# Patient Record
Sex: Female | Born: 1995 | Hispanic: No | Marital: Single | Smoking: Current some day smoker
Health system: Southern US, Community
[De-identification: ages and names within clinical notes are randomized; demographics above are authoritative.]

## PROBLEM LIST (undated history)

## (undated) DIAGNOSIS — D649 Anemia, unspecified: Secondary | ICD-10-CM

---
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
## 2017-02-18 IMAGING — CR DG CHEST 1V PORT
1 series · 1 of 1 positions shown · non-contrast
Comparison: None.

CLINICAL DATA: Found unresponsive. Hyperventilated. Altered mental
status. Anemia.

EXAM:
PORTABLE CHEST 1 VIEW

[portable]
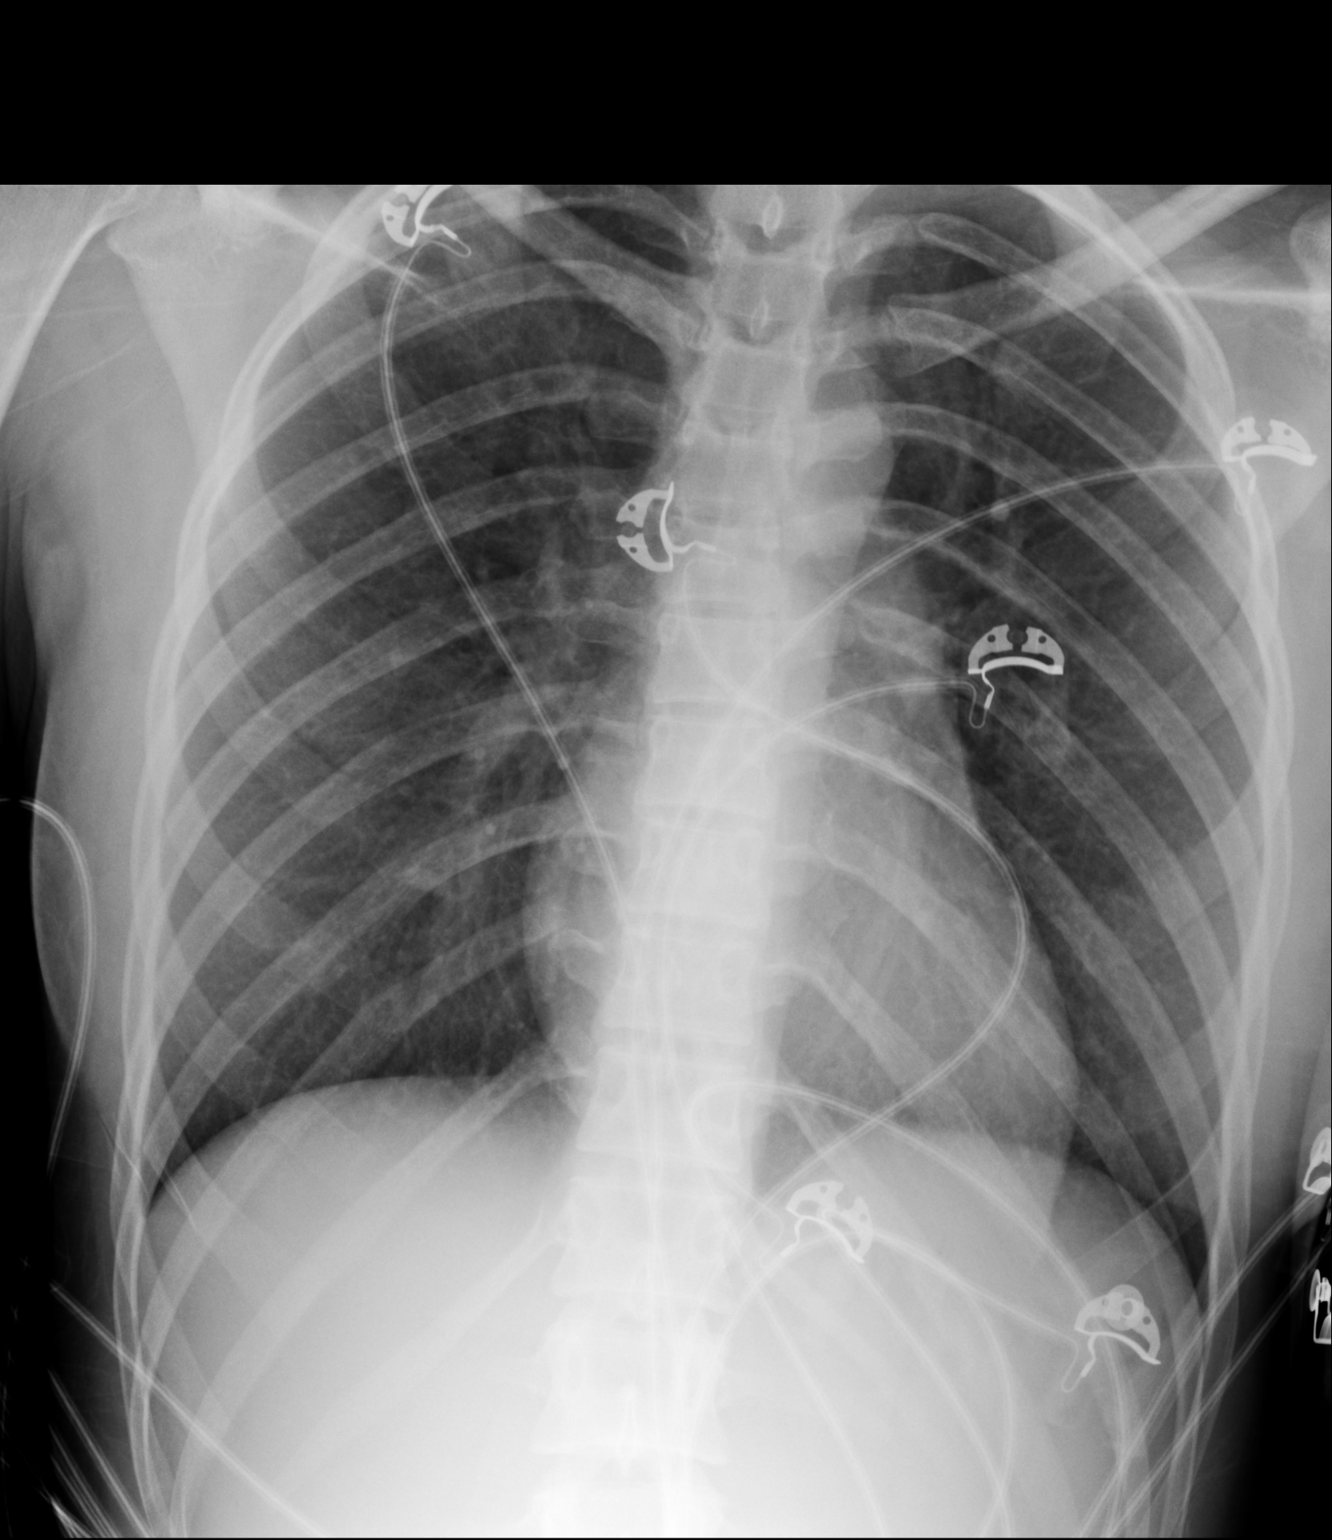

[1 of 1 positions shown; findings below may reference images not displayed]

FINDINGS: The heart size and mediastinal contours are within normal limits.
Both lungs are clear. No evidence of pneumothorax or pleural
effusion.
IMPRESSION: No active disease.

## 2019-04-10 ENCOUNTER — Ambulatory Visit: Payer: Self-pay | Admitting: Nurse Practitioner

## 2019-04-10 ENCOUNTER — Encounter: Payer: Self-pay | Admitting: Nurse Practitioner

## 2019-04-10 ENCOUNTER — Other Ambulatory Visit: Payer: Self-pay

## 2019-04-10 DIAGNOSIS — Z113 Encounter for screening for infections with a predominantly sexual mode of transmission: Secondary | ICD-10-CM

## 2019-04-10 DIAGNOSIS — B9689 Other specified bacterial agents as the cause of diseases classified elsewhere: Secondary | ICD-10-CM

## 2019-04-10 DIAGNOSIS — N898 Other specified noninflammatory disorders of vagina: Secondary | ICD-10-CM

## 2019-04-10 DIAGNOSIS — N760 Acute vaginitis: Secondary | ICD-10-CM

## 2019-04-10 LAB — WET PREP FOR TRICH, YEAST, CLUE
Trichomonas Exam: NEGATIVE
Yeast Exam: NEGATIVE

## 2019-04-10 MED ORDER — METRONIDAZOLE 500 MG PO TABS: 500.0000 mg | ORAL_TABLET | Freq: Two times a day (BID) | ORAL | 0 refills | Status: DC

## 2019-04-10 NOTE — Progress Notes (Signed)
Desires STD testing except for bloodwork.Shona Needles, RN

## 2019-04-10 NOTE — Progress Notes (Signed)
Client admits to Hx of GC in the past     STI clinic/screening visit  Subjective:  Priscilla Farmer is a 23 y.o. female being seen today for an STI screening visit. The patient reports they do have symptoms.  Patient has the following medical conditions:   Patient Active Problem List   Diagnosis Date Noted  . Suicide attempt ([place]) 09/24/2015  . Depression 09/24/2015  . Overdose 09/24/2015     Chief Complaint  Patient presents with  . SEXUALLY TRANSMITTED DISEASE    Here for STD testing - declines blood work at this time    Patient reports - vaginal discharge noted  See flowsheet for further details and programmatic requirements.    The following portions of the patient's history were reviewed and updated as appropriate: allergies, current medications, past medical history, past social history, past surgical history and problem list.  Objective:  There were no vitals filed for this visit.  Physical Exam Vitals signs and nursing note reviewed.  Constitutional:      Appearance: Normal appearance.  HENT:     Head: Normocephalic and atraumatic.     Mouth/Throat:     Mouth: Mucous membranes are moist.     Pharynx: Oropharynx is clear. No oropharyngeal exudate or posterior oropharyngeal erythema.  Pulmonary:     Effort: Pulmonary effort is normal.  Abdominal:     General: Abdomen is flat.     Palpations: There is no mass.     Tenderness: There is no abdominal tenderness. There is no rebound.  Genitourinary:    General: Normal vulva.     Exam position: Lithotomy position.     Pubic Area: No rash or pubic lice.      Labia:        Right: No rash or lesion.        Left: No rash or lesion.      Vagina: Normal. No vaginal discharge, erythema, bleeding or lesions.     Cervix: No cervical motion tenderness, discharge (large amt white discharge - >4.5 ph and slight odor noted), friability, lesion or erythema.     Uterus: Normal.      Adnexa: Right adnexa normal and left  adnexa normal.     Rectum: Normal.  Lymphadenopathy:     Head:     Right side of head: No preauricular or posterior auricular adenopathy.     Left side of head: No preauricular or posterior auricular adenopathy.     Cervical: No cervical adenopathy.     Upper Body:     Right upper body: No supraclavicular or axillary adenopathy.     Left upper body: No supraclavicular or axillary adenopathy.     Lower Body: No right inguinal adenopathy. No left inguinal adenopathy.  Skin:    General: Skin is warm and dry.     Findings: No rash.  Neurological:     Mental Status: She is alert and oriented to person, place, and time.       Assessment and Plan:  Priscilla Farmer is a 23 y.o. female presenting to the [facility] Department for STI screening  1. Vaginal discharge Await results - WET PREP FOR [place] - provider to review wet mount results  2. Screening examination for STD (sexually transmitted disease) Await results - WET PREP FOR [place], YEAST, CLUE - provider to review wet mount results - Chlamydia/Gonorrhea [facility]  Client verbalizes understanding and is in agreement with plan of care  3.  Bacterial vaginosis Please treat client for BV per standing order    Return in about 1 month (around 05/11/2019), or if symptoms worsen or fail to improve, for Yearly physical exam, pap completion.  No future appointments.  Donn PieriniKarla W Leath, NP

## 2019-06-19 ENCOUNTER — Ambulatory Visit: Payer: Medicaid Other

## 2019-07-10 ENCOUNTER — Other Ambulatory Visit: Payer: Self-pay

## 2019-07-10 ENCOUNTER — Ambulatory Visit: Payer: Self-pay | Admitting: Physician Assistant

## 2019-07-10 DIAGNOSIS — Z113 Encounter for screening for infections with a predominantly sexual mode of transmission: Secondary | ICD-10-CM

## 2019-07-10 DIAGNOSIS — N760 Acute vaginitis: Secondary | ICD-10-CM

## 2019-07-10 DIAGNOSIS — B9689 Other specified bacterial agents as the cause of diseases classified elsewhere: Secondary | ICD-10-CM

## 2019-07-10 DIAGNOSIS — Z299 Encounter for prophylactic measures, unspecified: Secondary | ICD-10-CM

## 2019-07-10 LAB — WET PREP FOR TRICH, YEAST, CLUE
Trichomonas Exam: NEGATIVE
Yeast Exam: NEGATIVE

## 2019-07-10 MED ORDER — METRONIDAZOLE 500 MG PO TABS: 500.0000 mg | ORAL_TABLET | Freq: Two times a day (BID) | ORAL | 0 refills | Status: AC

## 2019-07-10 MED ORDER — CLOTRIMAZOLE 1 % VA CREA: 1.0000 | TOPICAL_CREAM | Freq: Every day | VAGINAL | 0 refills | Status: AC

## 2019-07-10 NOTE — Progress Notes (Signed)
Wet mount reviewed. Per verbal order by Antoine Primas, PA, pt treated for BV per standing order; and after taking Metronidazole for about 3-4 days for BV, pt to start Clotrimazole 1%  Vaginal cream 1 applicator full in vagina qhs x 7 days for prophylactic tx for yeast. Provider orders completed.

## 2019-07-11 ENCOUNTER — Encounter: Payer: Self-pay | Admitting: Physician Assistant

## 2019-07-11 NOTE — Progress Notes (Signed)
STI clinic/screening visit  Subjective:  Priscilla Farmer is a 23 y.o. female being seen today for an STI screening visit. The patient reports they do have symptoms.  Patient has the following medical conditions:   Patient Active Problem List   Diagnosis Date Noted  . Suicide attempt ([facility]) 09/24/2015  . Depression 09/24/2015  . Overdose 09/24/2015     Chief Complaint  Patient presents with  . SEXUALLY TRANSMITTED DISEASE    HPI  Patient reports that she was treated for BV about 2 months ago and had a "bad" yeast infection after taking the medicine for BV.  Thinks that she may have BV again.  Reports increased amount of vaginal discharge and slight odor.    See flowsheet for further details and programmatic requirements.    The following portions of the patient's history were reviewed and updated as appropriate: allergies, current medications, past medical history, past social history, past surgical history and problem list.  Objective:  There were no vitals filed for this visit.  Physical Exam Constitutional:      General: She is not in acute distress.    Appearance: Normal appearance. She is normal weight.  HENT:     Head: Normocephalic and atraumatic.     Mouth/Throat:     Mouth: Mucous membranes are moist.     Pharynx: Oropharynx is clear. No oropharyngeal exudate or posterior oropharyngeal erythema.  Eyes:     Conjunctiva/sclera: Conjunctivae normal.  Neck:     Musculoskeletal: Neck supple.  Pulmonary:     Effort: Pulmonary effort is normal.  Abdominal:     Palpations: Abdomen is soft. There is no mass.     Tenderness: There is no abdominal tenderness. There is no guarding or rebound.  Genitourinary:    General: Normal vulva.     Rectum: Normal.     Comments: External genitalia/pubic area without nits, lice, edema, erythema, lesions and inguinal adenopathy. Vagina with normal mucosa and moderate amount of thin, grayish discharge, pH= >4.5. Cervix without  visible lesions. Uterus firm, mobile, nt, no masses, no CMT, no adnexal tenderness or fullness. Lymphadenopathy:     Cervical: No cervical adenopathy.  Skin:    General: Skin is warm and dry.     Findings: No bruising, erythema, lesion or rash.  Neurological:     Mental Status: She is alert and oriented to person, place, and time.  Psychiatric:        Mood and Affect: Mood normal.        Behavior: Behavior normal.        Thought Content: Thought content normal.        Judgment: Judgment normal.       Assessment and Plan:  Priscilla Farmer is a 23 y.o. female presenting to the [facility] Department for STI screening  1. Screening for STD (sexually transmitted disease) Patient into clinic with symptoms.  Declines blood work today. Rec condoms with all sex. Await test results.  Counseled that RN will call if needs to RTC for treatment once results are back.  - WET PREP FOR [place], YEAST, CLUE - Chlamydia/Gonorrhea [facility]  2. BV (bacterial vaginosis) Treat for BV with Metronidazole 500mg  #14 1 po BID for 7 days with food, no EtOH for 24 hr before and until 72 hr after taking medicine. Reviewed with patient steps to prevent BV. - metroNIDAZOLE (FLAGYL) 500 MG tablet; Take 1 tablet (500 mg total) by mouth 2 (two) times daily for 7  days.  Dispense: 14 tablet; Refill: 0  3. Prophylactic measure Will give Clotrimazole 1% vaginal cream to use to prevent yeast due to antibiotic use. - clotrimazole (CLOTRIMAZOLE-7) 1 % vaginal cream; Place 1 Applicatorful vaginally at bedtime for 7 days.  Dispense: 45 g; Refill: 0     No follow-ups on file.  No future appointments.  Jerene Dilling, PA

## 2019-09-24 ENCOUNTER — Ambulatory Visit: Payer: Medicaid Other

## 2019-10-01 ENCOUNTER — Encounter: Payer: Self-pay | Admitting: Physician Assistant

## 2019-10-01 ENCOUNTER — Ambulatory Visit: Payer: Self-pay | Admitting: Physician Assistant

## 2019-10-01 ENCOUNTER — Other Ambulatory Visit: Payer: Self-pay

## 2019-10-01 DIAGNOSIS — B9689 Other specified bacterial agents as the cause of diseases classified elsewhere: Secondary | ICD-10-CM

## 2019-10-01 DIAGNOSIS — Z113 Encounter for screening for infections with a predominantly sexual mode of transmission: Secondary | ICD-10-CM

## 2019-10-01 DIAGNOSIS — N760 Acute vaginitis: Secondary | ICD-10-CM

## 2019-10-01 LAB — WET PREP FOR TRICH, YEAST, CLUE
Trichomonas Exam: NEGATIVE
Yeast Exam: NEGATIVE

## 2019-10-01 MED ORDER — METRONIDAZOLE 500 MG PO TABS: 500.0000 mg | ORAL_TABLET | Freq: Two times a day (BID) | ORAL | 0 refills | Status: AC

## 2019-10-01 NOTE — Progress Notes (Signed)
Here for Std screen. Declines HIV/RPR Richmond Campbell, RN   Wet mount reviewed. MTX given per [place], [place] order Richmond Campbell, RN

## 2019-10-01 NOTE — Progress Notes (Signed)
[facility] Department STI clinic/screening visit  Subjective:  Priscilla Farmer is a 24 y.o. female being seen today for an STI screening visit. The patient reports they do have symptoms.  Patient reports that they do not desire a pregnancy in the next year.   They reported they are not interested in discussing contraception today.  No LMP recorded.   Patient has the following medical conditions:   Patient Active Problem List   Diagnosis Date Noted  . Suicide attempt ([facility]) 09/24/2015  . Depression 09/24/2015  . Overdose 09/24/2015    Chief Complaint  Patient presents with  . SEXUALLY TRANSMITTED DISEASE    Screening    HPI  Patient reports that she has noticed a thick, white discharge and slight odor.  Denies other symptoms.  Declines blood work today.  LMP 09/09/2019 and normal.  Using condoms for [facility].  See flowsheet for further details and programmatic requirements.    The following portions of the patient's history were reviewed and updated as appropriate: allergies, current medications, past medical history, past social history, past surgical history and problem list.  Objective:  There were no vitals filed for this visit.  Physical Exam Constitutional:      General: She is not in acute distress.    Appearance: Normal appearance. She is normal weight.  HENT:     Head: Normocephalic and atraumatic.     Comments: No nits, lice, or hair loss. No cervical, supraclavicular or axillary adenopathy.    Mouth/Throat:     Mouth: Mucous membranes are moist.     Pharynx: Oropharynx is clear. No oropharyngeal exudate or posterior oropharyngeal erythema.  Eyes:     Conjunctiva/sclera: Conjunctivae normal.  Pulmonary:     Effort: Pulmonary effort is normal.  Abdominal:     Palpations: Abdomen is soft. There is no mass.     Tenderness: There is no abdominal tenderness. There is no guarding or rebound.  Genitourinary:    General: Normal vulva.     Rectum: Normal.      Comments: External genitalia/pubic area without nits, lice, edema, erythema, lesions and inguinal adenopathy. Vagina with normal mucosa, small amount of white d/c, pH=>4.5. Cervix without visible lesions. Uterus firm, mobile, nt, no masses, no CMT, no adnexal tenderness or fullness. Musculoskeletal:     Cervical back: Neck supple. No tenderness.  Skin:    General: Skin is warm and dry.     Findings: No bruising, erythema, lesion or rash.  Neurological:     Mental Status: She is alert and oriented to person, place, and time.  Psychiatric:        Mood and Affect: Mood normal.        Behavior: Behavior normal.        Thought Content: Thought content normal.        Judgment: Judgment normal.      Assessment and Plan:  Priscilla Farmer is a 24 y.o. female presenting to the [facility] Department for STI screening  1. Screening for STD (sexually transmitted disease) Patient into clinic with symptoms.  Declines blood work today. Rec condoms with all sex. Await test results. Counseled patient that RN will call if needs to RTC for further treatment once results are back.  Reviewed with patient steps to prevent BV and yeast: Wear all-cotton underwear Sleep without underwear Take showers instead of baths Wear loose fitting clothing, especially during warm/hot weather Use a hair dryer on low after bathing to dry the area Avoid scented  soaps and body washes Use condoms or void after sex to allow seminal fluid to come out of the vagina due to gravity. Do not douche May try over the counter probiotics or boric acid gel or suppositories Stop smoking - WET PREP FOR [place], YEAST, [facility]  2. BV (bacterial vaginosis) Will treat BV with Metronidazole 500mg  #14 1 po BID for 7 days with food, no EtOH for 24 hr before and until 72 hr after completing medicine. No sex for 7 days. Rec using OTC antifungal cream if has itching during or just after  antibiotic treatment. - metroNIDAZOLE (FLAGYL) 500 MG tablet; Take 1 tablet (500 mg total) by mouth 2 (two) times daily for 7 days.  Dispense: 14 tablet; Refill: 0     No follow-ups on file.  No future appointments.  Jerene Dilling, PA

## 2019-10-17 ENCOUNTER — Telehealth: Payer: Self-pay | Admitting: Nurse Practitioner

## 2019-10-17 DIAGNOSIS — B370 Candidal stomatitis: Secondary | ICD-10-CM

## 2019-10-17 MED ORDER — NYSTATIN 100000 UNIT/ML MT SUSP: 5.0000 mL | Freq: Four times a day (QID) | OROMUCOSAL | 0 refills | Status: DC

## 2019-10-17 NOTE — Progress Notes (Signed)
We are sorry that you are not feeling well.  Here is how we plan to help!  Your symptoms indicate a likely oral candidiasis. I have prescribed nystatin to swish and swallow 3x a day.  Providers prescribe antibiotics to treat infections caused by bacteria. Antibiotics are very powerful in treating bacterial infections when they are used properly.  To maintain their effectiveness, they should be used only when necessary.  Overuse of antibiotics has resulted in the development of super bugs that are resistant to treatment!    Home Care:  Only take medications as instructed by your medical team.  Do not drink alcohol while taking these medications.  A steam or ultrasonic humidifier can help congestion.  You can place a towel over your head and breathe in the steam from hot water coming from a faucet.  Avoid close contacts especially the very young and the elderly.  Cover your mouth when you cough or sneeze.  Always remember to wash your hands.  Get Help Right Away If:  You develop worsening fever or throat pain.  You develop a severe head ache or visual changes.  Your symptoms persist after you have completed your treatment plan.  Make sure you  Understand these instructions.  Will watch your condition.  Will get help right away if you are not doing well or get worse.  Your e-visit answers were reviewed by a board certified advanced clinical practitioner to complete your personal care plan.  Depending on the condition, your plan could have included both over the counter or prescription medications.  If there is a problem please reply  once you have received a response from your provider.  Your safety is important to [place].  If you have drug allergies check your prescription carefully.    You can use MyChart to ask questions about todays visit, request a non-urgent call back, or ask for a work or school excuse for 24 hours related to this e-Visit. If it has been greater than 24  hours you will need to follow up with your provider, or enter a new e-Visit to address those concerns.  You will get an e-mail in the next two days asking about your experience.  I hope that your e-visit has been valuable and will speed your recovery. Thank you for using e-visits.   5-10 minutes spent reviewing and documenting in chart.

## 2019-11-05 ENCOUNTER — Ambulatory Visit: Payer: Medicaid Other

## 2019-12-16 ENCOUNTER — Other Ambulatory Visit: Payer: Self-pay

## 2019-12-16 ENCOUNTER — Ambulatory Visit: Payer: Self-pay | Admitting: Physician Assistant

## 2019-12-16 DIAGNOSIS — Z792 Long term (current) use of antibiotics: Secondary | ICD-10-CM

## 2019-12-16 DIAGNOSIS — Z113 Encounter for screening for infections with a predominantly sexual mode of transmission: Secondary | ICD-10-CM

## 2019-12-16 LAB — WET PREP FOR TRICH, YEAST, CLUE
Trichomonas Exam: NEGATIVE
Yeast Exam: NEGATIVE

## 2019-12-16 MED ORDER — METRONIDAZOLE 500 MG PO TABS
500.0000 mg | ORAL_TABLET | Freq: Once | ORAL | 0 refills | Status: AC
Start: 2019-12-16 — End: 2019-12-16

## 2019-12-17 ENCOUNTER — Encounter: Payer: Self-pay | Admitting: Physician Assistant

## 2019-12-17 NOTE — Progress Notes (Signed)
[facility] Department STI clinic/screening visit  Subjective:  Priscilla Farmer is a 24 y.o. female being seen today for an STI screening visit. The patient reports they do have symptoms.  Patient reports that they do not desire a pregnancy in the next year.   They reported they are not interested in discussing contraception today.  No LMP recorded.   Patient has the following medical conditions:   Patient Active Problem List   Diagnosis Date Noted  . Suicide attempt ([place]) 09/24/2015  . Depression 09/24/2015  . Overdose 09/24/2015    Chief Complaint  Patient presents with  . SEXUALLY TRANSMITTED DISEASE    HPI  Patient reports that she has hd an off-white discharge with "disgusting odor" for 2 days.  Denies other symptoms.  Denies chronic conditions and regular medicines.  Denies any history of surgery.  LMP 11/21/2019 and using condoms as BCM.   See flowsheet for further details and programmatic requirements.    The following portions of the patient's history were reviewed and updated as appropriate: allergies, current medications, past medical history, past social history, past surgical history and problem list.  Objective:  There were no vitals filed for this visit.  Physical Exam Constitutional:      General: She is not in acute distress.    Appearance: Normal appearance.  HENT:     Head: Normocephalic and atraumatic.     Comments: No nits, lice, or hair loss. No cervical, supraclavicular or axillary adenopathy.     Mouth/Throat:     Mouth: Mucous membranes are moist.     Pharynx: Oropharynx is clear. No oropharyngeal exudate or posterior oropharyngeal erythema.  Eyes:     Conjunctiva/sclera: Conjunctivae normal.  Abdominal:     Palpations: Abdomen is soft. There is no mass.     Tenderness: There is no abdominal tenderness. There is no guarding or rebound.  Genitourinary:    General: Normal vulva.     Rectum: Normal.     Comments: External  genitalia/pubic area without nits, lice, edema, erythema, lesions and inguinal adenopathy. Vagina with normal mucosa, small amount of thin, grayish adherent discharge, pH=4.5. Cervix without visible lesions. Uterus firm, mobile, nt, no masses, no CMT, no adnexal tenderness or fullness. Musculoskeletal:     Cervical back: Neck supple. No tenderness.  Skin:    General: Skin is warm and dry.     Findings: No bruising, erythema, lesion or rash.  Neurological:     Mental Status: She is alert and oriented to person, place, and time.  Psychiatric:        Mood and Affect: Mood normal.        Behavior: Behavior normal.        Thought Content: Thought content normal.        Judgment: Judgment normal.      Assessment and Plan:  Christinna Sprung is a 24 y.o. female presenting to the [facility] Department for STI screening  1. Screening for STD (sexually transmitted disease) Patient into clinic with symptoms.  Declines blood work today. Rec condoms with all sex. Await test results.  Counseled that RN will call if needs to RTC for further treatment once results are back.  - WET PREP FOR [place], YEAST, [facility]  2. Prophylactic antibiotic Will treat for possible BV with Metronidazole 2 g po at one time with food, no EtOH for 24 hr before and until 72 hr after taking medicine. No sex for 7 days.  Use OTC antifungal cream if has itching after taking antibiotics. - metroNIDAZOLE (FLAGYL) 500 MG tablet; Take 1 tablet (500 mg total) by mouth once for 1 dose.  Dispense: 4 tablet; Refill: 0     No follow-ups on file.  No future appointments.  Matt Holmes, PA

## 2019-12-20 NOTE — Progress Notes (Signed)
Chart reviewed by Pharmacist  Suzanne Walker PharmD, Contract Pharmacist at [facility]  

## 2019-12-20 NOTE — Progress Notes (Signed)
Chart reviewed by Pharmacist  Suzanne Walker PharmD, Contract Pharmacist at [facility]  

## 2020-02-19 ENCOUNTER — Telehealth: Payer: Self-pay | Admitting: Emergency Medicine

## 2020-02-19 DIAGNOSIS — N760 Acute vaginitis: Secondary | ICD-10-CM

## 2020-02-19 MED ORDER — METRONIDAZOLE 500 MG PO TABS: 500.0000 mg | ORAL_TABLET | Freq: Two times a day (BID) | ORAL | 0 refills | Status: DC

## 2020-02-19 MED ORDER — FLUCONAZOLE 150 MG PO TABS: 150.0000 mg | ORAL_TABLET | Freq: Once | ORAL | 0 refills | Status: AC

## 2020-02-19 NOTE — Progress Notes (Signed)
We are sorry that you are not feeling well. Here is how we plan to help! Based on what you shared with me it looks like you: May have a vaginosis due to bacteria  Vaginosis is an inflammation of the vagina that can result in discharge, itching and pain. The cause is usually a change in the normal balance of vaginal bacteria or an infection. Vaginosis can also result from reduced estrogen levels after menopause.  The most common causes of vaginosis are:   Bacterial vaginosis which results from an overgrowth of one on several organisms that are normally present in your vagina.   Yeast infections which are caused by a naturally occurring fungus called candida.   Vaginal atrophy (atrophic vaginosis) which results from the thinning of the vagina from reduced estrogen levels after menopause.   Trichomoniasis which is caused by a parasite and is commonly transmitted by sexual intercourse.  Factors that increase your risk of developing vaginosis include: Marland Kitchen Medications, such as antibiotics and steroids . Uncontrolled diabetes . Use of hygiene products such as bubble bath, vaginal spray or vaginal deodorant . Douching . Wearing damp or tight-fitting clothing . Using an intrauterine device (IUD) for birth control . Hormonal changes, such as those associated with pregnancy, birth control pills or menopause . Sexual activity . Having a sexually transmitted infection  Your treatment plan is Metronidazole or Flagyl 500mg  twice a day for 7 days.  I have electronically sent this prescription into the pharmacy that you have chosen.  I have also prescribed Diflucan 150 mg by mouth once.  Be sure to take all of the medication as directed. Stop taking any medication if you develop a rash, tongue swelling or shortness of breath. Mothers who are breast feeding should consider pumping and discarding their breast milk while on these antibiotics. However, there is no consensus that infant exposure at these doses  would be harmful.  Remember that medication creams can weaken latex condoms.   HOME CARE:  Good hygiene may prevent some types of vaginosis from recurring and may relieve some symptoms:  . Avoid baths, hot tubs and whirlpool spas. Rinse soap from your outer genital area after a shower, and dry the area well to prevent irritation. Don't use scented or harsh soaps, such as those with deodorant or antibacterial action. [place] Kitchen Avoid irritants. These include scented tampons and pads. . Wipe from front to back after using the toilet. Doing so avoids spreading fecal bacteria to your vagina.  Other things that may help prevent vaginosis include:  Marland Kitchen Don't douche. Your vagina doesn't require cleansing other than normal bathing. Repetitive douching disrupts the normal organisms that reside in the vagina and can actually increase your risk of vaginal infection. Douching won't clear up a vaginal infection. . Use a latex condom. Both female and female latex condoms may help you avoid infections spread by sexual contact. . Wear cotton underwear. Also wear pantyhose with a cotton crotch. If you feel comfortable without it, skip wearing underwear to bed. Yeast thrives in [place] Kitchen Your symptoms should improve in the next day or two.  GET HELP RIGHT AWAY IF:  . You have pain in your lower abdomen ( pelvic area or over your ovaries) . You develop nausea or vomiting . You develop a fever . Your discharge changes or worsens . You have persistent pain with intercourse . You develop shortness of breath, a rapid pulse, or you faint.  These symptoms could be signs of problems or infections  that need to be evaluated by a medical provider now.  MAKE SURE YOU    Understand these instructions.  Will watch your condition.  Will get help right away if you are not doing well or get worse.  Your e-visit answers were reviewed by a board certified advanced clinical practitioner to complete your personal  care plan. Depending upon the condition, your plan could have included both over the counter or prescription medications. Please review your pharmacy choice to make sure that you have choses a pharmacy that is open for you to pick up any needed prescription, Your safety is important to [place]. If you have drug allergies check your prescription carefully.   You can use MyChart to ask questions about today's visit, request a non-urgent call back, or ask for a work or school excuse for 24 hours related to this e-Visit. If it has been greater than 24 hours you will need to follow up with your provider, or enter a new e-Visit to address those concerns. You will get a MyChart message within the next two days asking about your experience. I hope that your e-visit has been valuable and will speed your recovery.  **Please do not respond to this message unless you have follow up questions.** Greater than 5 but less than 10 minutes spent researching, coordinating, and implementing care for this patient today

## 2020-02-23 ENCOUNTER — Ambulatory Visit: Payer: Medicaid Other

## 2020-04-05 ENCOUNTER — Emergency Department
Admission: EM | Admit: 2020-04-05 | Discharge: 2020-04-06 | Disposition: A | Discharge status: Home / Self Care | Payer: Self-pay | Attending: Emergency Medicine | Admitting: Emergency Medicine

## 2020-04-05 ENCOUNTER — Other Ambulatory Visit: Payer: Self-pay

## 2020-04-05 ENCOUNTER — Emergency Department: Payer: Self-pay

## 2020-04-05 ENCOUNTER — Encounter: Payer: Self-pay | Admitting: Emergency Medicine

## 2020-04-05 DIAGNOSIS — Z3202 Encounter for pregnancy test, result negative: Secondary | ICD-10-CM | POA: Insufficient documentation

## 2020-04-05 DIAGNOSIS — F17290 Nicotine dependence, other tobacco product, uncomplicated: Secondary | ICD-10-CM | POA: Insufficient documentation

## 2020-04-05 DIAGNOSIS — R1032 Left lower quadrant pain: Secondary | ICD-10-CM | POA: Insufficient documentation

## 2020-04-05 DIAGNOSIS — K5900 Constipation, unspecified: Secondary | ICD-10-CM | POA: Insufficient documentation

## 2020-04-05 DIAGNOSIS — R102 Pelvic and perineal pain: Secondary | ICD-10-CM | POA: Insufficient documentation

## 2020-04-05 DIAGNOSIS — R110 Nausea: Secondary | ICD-10-CM | POA: Insufficient documentation

## 2020-04-05 LAB — URINALYSIS, COMPLETE (UACMP) WITH MICROSCOPIC
Bacteria, UA: NONE SEEN
Bilirubin Urine: NEGATIVE
Glucose, UA: NEGATIVE mg/dL
Ketones, ur: NEGATIVE mg/dL
Nitrite: NEGATIVE
Protein, ur: 30 mg/dL — AB
RBC / HPF: >50 RBC/hpf — ABNORMAL HIGH (ref 0–5)
Specific Gravity, Urine: 1.029 (ref 1.005–1.030)
WBC, UA: >50 WBC/hpf — ABNORMAL HIGH (ref 0–5)
pH: 5 (ref 5.0–8.0)

## 2020-04-05 LAB — COMPREHENSIVE METABOLIC PANEL
ALT: 11 U/L (ref 0–44)
AST: 14 U/L — ABNORMAL LOW (ref 15–41)
Albumin: 4.3 g/dL (ref 3.5–5.0)
Alkaline Phosphatase: 42 U/L (ref 38–126)
Anion gap: 10 (ref 5–15)
BUN: 13 mg/dL (ref 6–20)
CO2: 27 mmol/L (ref 22–32)
Calcium: 9.2 mg/dL (ref 8.9–10.3)
Chloride: 102 mmol/L (ref 98–111)
Creatinine, Ser: 0.82 mg/dL (ref 0.44–1.00)
GFR calc Af Amer: >60 mL/min (ref >60)
GFR calc non Af Amer: >60 mL/min (ref >60)
Glucose, Bld: 108 mg/dL — ABNORMAL HIGH (ref 70–99)
Potassium: 3.7 mmol/L (ref 3.5–5.1)
Sodium: 139 mmol/L (ref 135–145)
Total Bilirubin: 0.8 mg/dL (ref 0.3–1.2)
Total Protein: 7.4 g/dL (ref 6.5–8.1)

## 2020-04-05 LAB — CBC
HCT: 37.5 % (ref 36.0–46.0)
Hemoglobin: 13.6 g/dL (ref 12.0–15.0)
MCH: 27.5 pg (ref 26.0–34.0)
MCHC: 36.3 g/dL — ABNORMAL HIGH (ref 30.0–36.0)
MCV: 75.9 fL — ABNORMAL LOW (ref 80.0–100.0)
Platelets: 304 10*3/uL (ref 150–400)
RBC: 4.94 MIL/uL (ref 3.87–5.11)
RDW: 13.2 % (ref 11.5–15.5)
WBC: 9 10*3/uL (ref 4.0–10.5)
nRBC: 0 % (ref 0.0–0.2)

## 2020-04-05 LAB — LIPASE, BLOOD: Lipase: 26 U/L (ref 11–51)

## 2020-04-05 LAB — POCT PREGNANCY, URINE: Preg Test, Ur: NEGATIVE

## 2020-04-05 MED ORDER — DICYCLOMINE HCL 10 MG PO CAPS
10.0000 mg | ORAL_CAPSULE | Freq: Once | ORAL | Status: AC
  Administered 2020-04-05: 10 mg via ORAL
  Filled 2020-04-05: qty 1

## 2020-04-05 MED ORDER — IOHEXOL 300 MG/ML  SOLN
100.0000 mL | Freq: Once | INTRAMUSCULAR | Status: AC | PRN
  Administered 2020-04-05: 100 mL via INTRAVENOUS
  Filled 2020-04-05: qty 100

## 2020-04-05 MED ORDER — IOHEXOL 9 MG/ML PO SOLN
1000.0000 mL | Freq: Once | ORAL | Status: DC | PRN
  Administered 2020-04-05: 1000 mL via ORAL
  Filled 2020-04-05 (×2): qty 1000

## 2020-04-05 MED ORDER — DICYCLOMINE HCL 10 MG PO CAPS: 10.0000 mg | ORAL_CAPSULE | Freq: Four times a day (QID) | ORAL | 0 refills | Status: AC

## 2020-04-05 MED ORDER — MELOXICAM 15 MG PO TABS: 15.0000 mg | ORAL_TABLET | Freq: Every day | ORAL | 0 refills | Status: DC

## 2020-04-05 NOTE — ED Notes (Addendum)
Pt called for repeat VS but no answer. This tech called pt cell phone with no answer.

## 2020-04-05 NOTE — ED Triage Notes (Signed)
Patient to ER for c/o abd pain that began yesterday. Patient states pain has gone from cramping sensation to sharp pain since it began, states pain was at middle of abd and now has moved to LLQ. Patient reports she felt bloated so she took a laxative. Reports she had adequate BM, but pain was not relieved. Then noticed blood from vagina (LMP prior was 2 weeks before). States today there is no blood on pad.

## 2020-04-05 NOTE — ED Provider Notes (Signed)
[facility] Emergency Department Provider Note  ____________________________________________  Time seen: Approximately 3:35 PM  I have reviewed the triage vital signs and the nursing notes.   HISTORY  Chief Complaint Abdominal Pain    HPI Priscilla Farmer is a 24 y.o. female who presents the emergency department complaining of left lower quadrant abdominal pain.  Patient states that she had diffuse abdominal cramping 4 days ago.  She states that at times she thought she was constipated and started a laxative.  Patient states that she took 2 days of a pill for laxative with no relief and then drank a liquid and eventually resulted in a large bowel movement.  Patient states that the pain has now focused in her left lower quadrant.  She states that she had some spotting yesterday but no frank bleeding.  She denies any vaginal discharge.  LMP was 2 weeks ago.  Patient endorses nausea but no emesis.  No blood in her stools.  According to the mother who is with the patient, the patient is struggled with abdominal complaints over the last decade.  Patient had findings concerning for Crohn's at age 40 but they did not pursue colonoscopy for confirmation at that time.  Patient denies any history of nephrolithiasis, no dysuria, polyuria, hematuria.  No fevers or chills.  No URI symptoms.         Past Medical History:  Diagnosis Date  . Anemia     Patient Active Problem List   Diagnosis Date Noted  . Suicide attempt (HCC) 09/24/2015  . Depression 09/24/2015  . Overdose 09/24/2015    History reviewed. No pertinent surgical history.  Prior to Admission medications   Medication Sig Start Date End Date Taking? Authorizing Provider  dicyclomine (BENTYL) 10 MG capsule Take 1 capsule (10 mg total) by mouth 4 (four) times daily for 14 days. 04/05/20 04/19/20  Markon Jares, Delorise Royals, PA-C  meloxicam (MOBIC) 15 MG tablet Take 1 tablet (15 mg total) by mouth daily. 04/05/20    Jalaila Caradonna, Delorise Royals, PA-C  nystatin (MYCOSTATIN) 100000 UNIT/ML suspension Take 5 mLs (500,000 Units total) by mouth 4 (four) times daily. 10/17/19   Bennie Pierini, FNP    Allergies Patient has no known allergies.  No family history on file.  Social History Social History   Tobacco Use  . Smoking status: Current Some Day Smoker    Types: E-cigarettes  . Smokeless tobacco: Never Used  Vaping Use  . Vaping Use: Some days  Substance Use Topics  . Alcohol use: Yes    Comment: occasionally  . Drug use: Never     Review of Systems  Constitutional: No fever/chills Eyes: No visual changes. No discharge ENT: No upper respiratory complaints. Cardiovascular: no chest pain. Respiratory: no cough. No SOB. Gastrointestinal: Left lower quadrant abdominal pain.  No nausea, no vomiting.  No diarrhea.  Positive constipation Genitourinary: Negative for dysuria. No hematuria Musculoskeletal: Negative for musculoskeletal pain. Skin: Negative for rash, abrasions, lacerations, ecchymosis. Neurological: Negative for headaches, focal weakness or numbness. 10-point ROS otherwise negative.  ____________________________________________   PHYSICAL EXAM:  VITAL SIGNS: ED Triage Vitals  Enc Vitals Group     BP 04/05/20 0828 136/73     Pulse Rate 04/05/20 0828 65     Resp --      Temp 04/05/20 0828 97.8 F (36.6 C)     Temp Source 04/05/20 0828 Oral     SpO2 04/05/20 0828 100 %     Weight 04/05/20 0829 138 lb (  62.6 kg)     Height 04/05/20 0829 5\' 4"  (1.626 m)     Head Circumference --      Peak Flow --      Pain Score 04/05/20 0829 7     Pain Loc --      Pain Edu? --      Excl. in GC? --      Constitutional: Alert and oriented. Well appearing and in no acute distress. Eyes: Conjunctivae are normal. PERRL. EOMI. Head: Atraumatic. ENT:      Ears:       Nose: No congestion/rhinnorhea.      Mouth/Throat: Mucous membranes are moist.  Neck: No stridor.   Cardiovascular:  Normal rate, regular rhythm. Normal S1 and S2.  Good peripheral circulation. Respiratory: Normal respiratory effort without tachypnea or retractions. Lungs CTAB. Good air entry to the bases with no decreased or absent breath sounds. Gastrointestinal: Bowel sounds 4 quadrants.  Soft to palpation all quadrants.  Patient is tender to palpation with mild guarding in the left lower quadrant.  No other tenderness to palpation.  No other guarding.. No rigidity. No palpable masses. No distention. No CVA tenderness. Musculoskeletal: Full range of motion to all extremities. No gross deformities appreciated. Neurologic:  Normal speech and language. No gross focal neurologic deficits are appreciated.  Skin:  Skin is warm, dry and intact. No rash noted. Psychiatric: Mood and affect are normal. Speech and behavior are normal. Patient exhibits appropriate insight and judgement.   ____________________________________________   LABS (all labs ordered are listed, but only abnormal results are displayed)  Labs Reviewed  COMPREHENSIVE METABOLIC PANEL - Abnormal; Notable for the following components:      Result Value   Glucose, Bld 108 (*)    AST 14 (*)    All other components within normal limits  CBC - Abnormal; Notable for the following components:   MCV 75.9 (*)    MCHC 36.3 (*)    All other components within normal limits  URINALYSIS, COMPLETE (UACMP) WITH MICROSCOPIC - Abnormal; Notable for the following components:   Color, Urine AMBER (*)    APPearance CLOUDY (*)    Hgb urine dipstick LARGE (*)    Protein, ur 30 (*)    Leukocytes,Ua MODERATE (*)    RBC / HPF >50 (*)    WBC, UA >50 (*)    All other components within normal limits  URINE CULTURE  LIPASE, BLOOD  POC URINE PREG, ED  POCT PREGNANCY, URINE   ____________________________________________  EKG   ____________________________________________  RADIOLOGY I personally viewed and evaluated these images as part of my medical  decision making, as well as reviewing the written report by the radiologist.  DG Abdomen 1 View  Result Date: 04/05/2020 CLINICAL DATA:  Left lower quadrant pain for 2 days. EXAM: ABDOMEN - 1 VIEW COMPARISON:  None. FINDINGS: Normal bowel gas pattern. No significant increase in the colonic stool burden. No evidence of renal or ureteral stones. Soft tissues are unremarkable. Normal skeletal structures. IMPRESSION: Negative. Electronically Signed   By: Amie Portlandavid  Ormond M.D.   On: 04/05/2020 16:24   CT ABDOMEN PELVIS W CONTRAST  Result Date: 04/05/2020 CLINICAL DATA:  Left lower quadrant pain EXAM: CT ABDOMEN AND PELVIS WITH CONTRAST TECHNIQUE: Multidetector CT imaging of the abdomen and pelvis was performed using the standard protocol following bolus administration of intravenous contrast. CONTRAST:  100mL OMNIPAQUE IOHEXOL 300 MG/ML  SOLN COMPARISON:  None. FINDINGS: Lower chest: Lung bases are clear. No effusions. Heart  is normal size. Hepatobiliary: No focal hepatic abnormality. Gallbladder unremarkable. Pancreas: No focal abnormality or ductal dilatation. Spleen: No focal abnormality.  Normal size. Adrenals/Urinary Tract: There is a punctate calcification posterior to the left bladder wall. It is difficult to follow the left ureter which is decompressed. I favor this represents a phlebolith although is difficult to completely exclude 2 mm distal left ureteral stone. No hydronephrosis. Adrenal glands and urinary bladder unremarkable. Stomach/Bowel: Normal appendix. Stomach, large and small bowel grossly unremarkable. Vascular/Lymphatic: No evidence of aneurysm or adenopathy. Reproductive: Uterus and adnexa unremarkable. No mass. 2 cm dominant follicle in the right ovary. Other: No free fluid or free air. Musculoskeletal: No acute bony abnormality. IMPRESSION: 2 mm calcification posterior to the left bladder wall. I favor this represents a phlebolith as there is no hydronephrosis. Is difficult to completely  exclude distal nonobstructing left ureteral stone. Recommend clinical correlation for flank pain and hematuria. Normal appendix. Electronically Signed   By: Charlett Nose M.D.   On: 04/05/2020 23:04   US PELVIC COMPLETE W TRANSVAGINAL AND TORSION R/O  Result Date: 04/05/2020 CLINICAL DATA:  24 year old female with left lower quadrant pain. EXAM: TRANSABDOMINAL AND TRANSVAGINAL ULTRASOUND OF PELVIS DOPPLER ULTRASOUND OF OVARIES TECHNIQUE: Both transabdominal and transvaginal ultrasound examinations of the pelvis were performed. Transabdominal technique was performed for global imaging of the pelvis including uterus, ovaries, adnexal regions, and pelvic cul-de-sac. It was necessary to proceed with endovaginal exam following the transabdominal exam to visualize the endometrium and ovaries. Color and duplex Doppler ultrasound was utilized to evaluate blood flow to the ovaries. COMPARISON:  None. FINDINGS: Uterus Measurements: 7.2 x 4.0 x 5.4 cm = volume: 81 mL. No fibroids or other mass visualized. Endometrium Thickness: 7 mm.  No focal abnormality visualized. Right ovary Measurements: 4.2 x 2.8 x 2.4 cm = volume: 14 mL. Normal appearance/no adnexal mass. There is a 2 cm dominant follicle or cyst. Left ovary Measurements: 3.2 x 2.7 x 2.5 cm = volume: 11 mL. Normal appearance/no adnexal mass. Pulsed Doppler evaluation of both ovaries demonstrates normal low-resistance arterial and venous waveforms. Other findings No abnormal free fluid. IMPRESSION: Unremarkable pelvic ultrasound. Electronically Signed   By: Elgie Collard M.D.   On: 04/05/2020 17:14    ____________________________________________    PROCEDURES  Procedure(s) performed:    Procedures    Medications  iohexol (OMNIPAQUE) 9 MG/ML oral solution 1,000 mL (1,000 mLs Oral Contrast Given 04/05/20 2009)  dicyclomine (BENTYL) capsule 10 mg (10 mg Oral Given 04/05/20 1923)  iohexol (OMNIPAQUE) 300 MG/ML solution 100 mL (100 mLs Intravenous  Contrast Given 04/05/20 2252)     ____________________________________________   INITIAL IMPRESSION / ASSESSMENT AND PLAN / ED COURSE  Pertinent labs & imaging results that were available during my care of the patient were reviewed by me and considered in my medical decision making (see chart for details).  Review of the [place] CSRS was performed in accordance of the [facility] prior to dispensing any controlled drugs.           Patient's diagnosis is consistent with left lower quadrant abdominal pain.  Patient presents emergency department complaining of left lower quadrant abdominal pain times several days.  Patient denies any dysuria, hematuria.  She states that she had some spotting yesterday but did not have any blood on her undergarments.  Patient has had no return of the symptoms.  No vaginal discharge.  No concern for STDs.  Patient had reassuring labs.  Given the tenderness in the left lower  quadrant she was evaluated for constipation with x-ray, ovarian torsion, cyst with ultrasound and infection versus nephrolithiasis with CT scan.  There is a small calcification in the left bladder that could be consistent with nephrolithiasis.  This matches patient's presentation, however again it is not conclusive.  I discussed with the patient treatment to include meloxicam and Bentyl.  Patient does have a history of chronic GI complaints for the past decade and there has been some concern in the past that the patient may have Crohn's.  There was no rectal bleeding.  No cobblestoning pattern on CT.  No colitis or enteritis identified..  If patient symptoms persist she should follow-up with GI.  If symptoms rapidly improve I feel this is likely nephrolithiasis.  No indication for further work-up.  Follow-up with primary care or GI as needed.  Patient is given ED precautions to return to the ED for any worsening or new symptoms.     ____________________________________________  FINAL CLINICAL IMPRESSION(S)  / ED DIAGNOSES  Final diagnoses:  Pelvic pain  LLQ pain      NEW MEDICATIONS STARTED DURING THIS VISIT:  ED Discharge Orders         Ordered    dicyclomine (BENTYL) 10 MG capsule  4 times daily     Discontinue  Reprint     04/05/20 2315    meloxicam (MOBIC) 15 MG tablet  Daily     Discontinue  Reprint     04/05/20 2315              This chart was dictated using voice recognition software/Dragon. Despite best efforts to proofread, errors can occur which can change the meaning. Any change was purely unintentional.    Racheal Patches, PA-C 04/05/20 2319    Phineas Semen, MD 04/06/20 1606

## 2020-04-06 LAB — URINE CULTURE

## 2020-04-11 ENCOUNTER — Other Ambulatory Visit: Payer: Self-pay

## 2020-04-11 DIAGNOSIS — R1084 Generalized abdominal pain: Secondary | ICD-10-CM | POA: Insufficient documentation

## 2020-04-11 DIAGNOSIS — Z5321 Procedure and treatment not carried out due to patient leaving prior to being seen by health care provider: Secondary | ICD-10-CM | POA: Insufficient documentation

## 2020-04-11 LAB — COMPREHENSIVE METABOLIC PANEL
ALT: 11 U/L (ref 0–44)
AST: 15 U/L (ref 15–41)
Albumin: 4 g/dL (ref 3.5–5.0)
Alkaline Phosphatase: 39 U/L (ref 38–126)
Anion gap: 10 (ref 5–15)
BUN: 19 mg/dL (ref 6–20)
CO2: 25 mmol/L (ref 22–32)
Calcium: 8.9 mg/dL (ref 8.9–10.3)
Chloride: 103 mmol/L (ref 98–111)
Creatinine, Ser: 1.17 mg/dL — ABNORMAL HIGH (ref 0.44–1.00)
GFR calc Af Amer: >60 mL/min (ref >60)
GFR calc non Af Amer: >60 mL/min (ref >60)
Glucose, Bld: 85 mg/dL (ref 70–99)
Potassium: 3.7 mmol/L (ref 3.5–5.1)
Sodium: 138 mmol/L (ref 135–145)
Total Bilirubin: 0.7 mg/dL (ref 0.3–1.2)
Total Protein: 7.8 g/dL (ref 6.5–8.1)

## 2020-04-11 LAB — CBC
HCT: 37 % (ref 36.0–46.0)
Hemoglobin: 13.2 g/dL (ref 12.0–15.0)
MCH: 27.7 pg (ref 26.0–34.0)
MCHC: 35.7 g/dL (ref 30.0–36.0)
MCV: 77.6 fL — ABNORMAL LOW (ref 80.0–100.0)
Platelets: 223 10*3/uL (ref 150–400)
RBC: 4.77 MIL/uL (ref 3.87–5.11)
RDW: 12.7 % (ref 11.5–15.5)
WBC: 10.6 10*3/uL — ABNORMAL HIGH (ref 4.0–10.5)
nRBC: 0 % (ref 0.0–0.2)

## 2020-04-11 LAB — LIPASE, BLOOD: Lipase: 23 U/L (ref 11–51)

## 2020-04-11 NOTE — ED Triage Notes (Signed)
Pt arrives via POV for reports of generalized abdominal pain and bloating since 08/09. Pt reports being seen for the same last week and was prescribed dicyclomine and states this is not working for her. Pt reports trouble with passing stool but states she did have a small amount of stool yesterday morning. Denies dysuria. PT speaking with this RN in NAD, skin warm and dry.

## 2020-04-11 NOTE — ED Notes (Signed)
Attempted blood draw x 2 without success, lab contacted to draw pt's blood. Pt sent to lobby to wait for blood draw

## 2020-04-12 ENCOUNTER — Emergency Department
Admission: EM | Admit: 2020-04-12 | Discharge: 2020-04-12 | Disposition: A | Discharge status: Home / Self Care | Payer: Self-pay | Attending: Emergency Medicine | Admitting: Emergency Medicine

## 2020-04-12 NOTE — ED Notes (Signed)
No answer when called several times from lobby 

## 2020-04-19 ENCOUNTER — Encounter (HOSPITAL_COMMUNITY): Payer: Self-pay | Admitting: Emergency Medicine

## 2020-04-19 ENCOUNTER — Emergency Department (HOSPITAL_COMMUNITY)
Admission: EM | Admit: 2020-04-19 | Discharge: 2020-04-19 | Disposition: A | Discharge status: Home / Self Care | Payer: Medicaid Other | Attending: Emergency Medicine | Admitting: Emergency Medicine

## 2020-04-19 ENCOUNTER — Other Ambulatory Visit: Payer: Self-pay

## 2020-04-19 DIAGNOSIS — R6883 Chills (without fever): Secondary | ICD-10-CM

## 2020-04-19 DIAGNOSIS — F17210 Nicotine dependence, cigarettes, uncomplicated: Secondary | ICD-10-CM | POA: Insufficient documentation

## 2020-04-19 DIAGNOSIS — Z79899 Other long term (current) drug therapy: Secondary | ICD-10-CM | POA: Insufficient documentation

## 2020-04-19 DIAGNOSIS — R109 Unspecified abdominal pain: Secondary | ICD-10-CM | POA: Insufficient documentation

## 2020-04-19 DIAGNOSIS — T50904A Poisoning by unspecified drugs, medicaments and biological substances, undetermined, initial encounter: Secondary | ICD-10-CM | POA: Insufficient documentation

## 2020-04-19 MED ORDER — SODIUM CHLORIDE 0.9 % IV BOLUS: 1000.0000 mL | Freq: Once | INTRAVENOUS | Status: DC

## 2020-04-19 NOTE — ED Notes (Signed)
Patient ambulatory to the lobby with a steady gait and belongings. 

## 2020-04-19 NOTE — Discharge Instructions (Signed)
You can either follow-up with wellness center or [facility].  Stay hydrated.  Avoid taking too much medicine.  Return to ER if you have thoughts of harming yourself or others, persistent fever.

## 2020-04-19 NOTE — ED Provider Notes (Signed)
[facility]-EMERGENCY DEPT Provider Note   CSN: [id number] Arrival date & time: 04/19/20  1541     History Chief Complaint  Patient presents with  . Drug Overdose    Priscilla Farmer is a 24 y.o. female history of anemia, depression, here presenting with possible overdose.  Patient's has ongoing abdominal pain for the last several months.  Patient went to  about a week ago and had a CT abdomen pelvis and ultrasound that did not show any particular causes of her pain.  Patient then went to [facility] 4 days ago and and was diagnosed with UTI and given antibiotics.  She is also taking MiraLAX for constipation and Bentyl.  Patient states that she has ongoing abdominal pain.  She states that she was frustrated and took 5-6 bentyl prior to arrival.  She has been waiting in the waiting about 6 hours.  She states that she has not been vomiting.  She denies any thoughts of harming herself or killing herself.  Patient had a low-grade temperature at triage.  She tested negative for Covid several days ago.  The history is provided by the patient.       Past Medical History:  Diagnosis Date  . Anemia     Patient Active Problem List   Diagnosis Date Noted  . Suicide attempt (HCC) 09/24/2015  . Depression 09/24/2015  . Overdose 09/24/2015    History reviewed. No pertinent surgical history.   OB History   No obstetric history on file.     History reviewed. No pertinent family history.  Social History   Tobacco Use  . Smoking status: Current Some Day Smoker    Types: E-cigarettes  . Smokeless tobacco: Never Used  Vaping Use  . Vaping Use: Some days  Substance Use Topics  . Alcohol use: Yes    Comment: occasionally  . Drug use: Never    Home Medications Prior to Admission medications   Medication Sig Start Date End Date Taking? Authorizing Provider  dicyclomine (BENTYL) 10 MG capsule Take 1 capsule (10 mg total) by mouth 4 (four) times daily for 14 days.  04/05/20 04/19/20  Cuthriell, Delorise Royals, PA-C  meloxicam (MOBIC) 15 MG tablet Take 1 tablet (15 mg total) by mouth daily. 04/05/20   Cuthriell, Delorise Royals, PA-C  nystatin (MYCOSTATIN) 100000 UNIT/ML suspension Take 5 mLs (500,000 Units total) by mouth 4 (four) times daily. 10/17/19   Bennie Pierini, FNP    Allergies    Patient has no known allergies.  Review of Systems   Review of Systems  Gastrointestinal: Positive for abdominal pain.  All other systems reviewed and are negative.   Physical Exam Updated Vital Signs BP 125/84 (BP Location: Right Arm)   Pulse 88   Temp 99.1 F (37.3 C) (Oral)   Resp 15   LMP 04/18/2020   SpO2 98%   Physical Exam Vitals and nursing note reviewed.  HENT:     Head: Normocephalic.     Nose: Nose normal.     Mouth/Throat:     Mouth: Mucous membranes are moist.  Eyes:     Extraocular Movements: Extraocular movements intact.     Pupils: Pupils are equal, round, and reactive to light.  Cardiovascular:     Rate and Rhythm: Normal rate and regular rhythm.     Pulses: Normal pulses.     Heart sounds: Normal heart sounds.  Pulmonary:     Effort: Pulmonary effort is normal.     Breath sounds: Normal  breath sounds.  Abdominal:     General: Abdomen is flat.     Palpations: Abdomen is soft.  Musculoskeletal:        General: Normal range of motion.     Cervical back: Normal range of motion.  Skin:    General: Skin is warm.     Capillary Refill: Capillary refill takes less than 2 seconds.  Neurological:     General: No focal deficit present.     Mental Status: She is alert and oriented to person, place, and time.  Psychiatric:     Comments: Not suicidal      ED Results / Procedures / Treatments   Labs (all labs ordered are listed, but only abnormal results are displayed) Labs Reviewed - No data to display  EKG None  Radiology No results found.  Procedures Procedures (including critical care time)  Medications Ordered in  ED Medications - No data to display  ED Course  I have reviewed the triage vital signs and the nursing notes.  Pertinent labs & imaging results that were available during my care of the patient were reviewed by me and considered in my medical decision making (see chart for details).    MDM Rules/Calculators/A&P                          Priscilla Farmer is a 24 y.o. female here presenting with possible overdose.  Patient tried to overdose on Bentyl but this is 6 hours ago.  Patient initially had a low-grade temperature.  Patient now is afebrile and no longer tachycardic.  Patient is passing gas.  Patient appears well.  I offered IV fluids and lab work patient states that she just wants to go home.  I asked her if she is going to kill herself when she gets home but she denies any suicidal ideations.  She states that she just very frustrated because she has ongoing abdominal pain.  She has no insurance so she was unable to see GI.  Will refer her to wellness center for follow-up.  Final Clinical Impression(s) / ED Diagnoses Final diagnoses:  None    Rx / DC Orders ED Discharge Orders    None       Charlynne Pander, MD 04/19/20 2120

## 2020-04-19 NOTE — ED Triage Notes (Signed)
Patient brought in by boyfriend, states she ingested approximately 5-6 prescribed bentyl. States she took them about 2 hours ago. States she has been depressed over past few weeks d/t being in pain from abdominal issues.

## 2020-04-21 ENCOUNTER — Ambulatory Visit: Payer: Self-pay | Admitting: Family Medicine

## 2020-04-21 ENCOUNTER — Other Ambulatory Visit: Payer: Self-pay

## 2020-04-21 ENCOUNTER — Encounter: Payer: Self-pay | Admitting: Family Medicine

## 2020-04-21 VITALS — BP 117/85 | Temp 101.7°F | Wt 138.6 lb

## 2020-04-21 DIAGNOSIS — N739 Female pelvic inflammatory disease, unspecified: Secondary | ICD-10-CM

## 2020-04-21 DIAGNOSIS — Z113 Encounter for screening for infections with a predominantly sexual mode of transmission: Secondary | ICD-10-CM

## 2020-04-21 DIAGNOSIS — A549 Gonococcal infection, unspecified: Secondary | ICD-10-CM

## 2020-04-21 LAB — WET PREP FOR TRICH, YEAST, CLUE
Trichomonas Exam: NEGATIVE
Yeast Exam: NEGATIVE

## 2020-04-21 NOTE — Progress Notes (Signed)
[facility] Department STI clinic/screening visit  Subjective:  Priscilla Farmer is a 24 y.o. female being seen today for an STI screening visit. The patient reports they do have symptoms.  Patient reports that they do not desire a pregnancy in the next year.   They reported they are not interested in discussing contraception today.  Patient's last menstrual period was 04/18/2020.   Patient has the following medical conditions:   Patient Active Problem List   Diagnosis Date Noted  . Suicide attempt (HCC) 09/24/2015  . Depression 09/24/2015  . Overdose 09/24/2015    Chief Complaint  Patient presents with  . SEXUALLY TRANSMITTED DISEASE    needs treatment for Gonorrhea    HPI  Patient reports abdominal pain x 3- 4 weeks.  Patient has has several ER visits with GI assessments and no relief.  Patient has positive gonorrhea result from [facility] from 04/15/20.  Patient has not been treated for this infection.  Patient is here for treatment for gonorrhea.    Patient has not been tested for HIV or had PAP.  See flowsheet for further details and programmatic requirements.    The following portions of the patient's history were reviewed and updated as appropriate: allergies, current medications, past medical history, past social history, past surgical history and problem list.  Objective:   Vitals:   04/21/20 1447  BP: 117/85  Temp: (!) 101.7 F (38.7 C)  Weight: 138 lb 9.6 oz (62.9 kg)    Physical Exam Constitutional:      General: She is in acute distress.  HENT:     Head: Normocephalic.     Mouth/Throat:     Mouth: Mucous membranes are moist.     Pharynx: Posterior oropharyngeal erythema present. No oropharyngeal exudate.  Abdominal:     General: There is no distension.     Palpations: There is no mass.     Tenderness: There is abdominal tenderness. There is guarding. There is no rebound.  Genitourinary:    Comments: External genitalia without, lice, nits,  erythema, edema , lesions or inguinal adenopathy. Vagina with normal mucosa and bloody discharge (menses) and pH > 4.  Cervix without visual lesions, uterus firm, mobile, non-tender, no masses, adnexal fullness.  CMT and tenderness present  Musculoskeletal:     Cervical back: Normal range of motion and neck supple. No tenderness.  Lymphadenopathy:     Cervical: No cervical adenopathy.  Skin:    General: Skin is warm and dry.     Findings: No bruising or erythema.     Comments: Hot to the touch, patient does have fever.   Neurological:     Mental Status: She is alert.  Psychiatric:        Mood and Affect: Mood normal.        Behavior: Behavior normal.      Assessment and Plan:  Priscilla Farmer is a 24 y.o. female presenting to the [facility] Department for STI screening  1. Screening examination for venereal disease  - WET PREP FOR TRICH, YEAST, CLUE Per wet prep no treatment needed.  Patient treated for positive gonorrhea from [facility] testing and PID, per standing order.  Patient treated with ceftriaxone 500 mg Im x once, doxycycline 100 mg PO BID x 14 days  And metronidazole 500 mg PO BID 14 days.    Patient PID f/u appointment scheduled  on 04/25/20.  -Pt is appearing in distress, patient temp 101.7, -Advised if pain or fever worsens  to go to ER.  -Discussed need for rescreen in 3 months.   Recommend no sex for next 14 days and condoms with all sex.       No follow-ups on file.  No future appointments.  Wendi Snipes, RN

## 2020-04-25 ENCOUNTER — Ambulatory Visit: Payer: Self-pay

## 2020-04-26 MED ORDER — METRONIDAZOLE 500 MG PO TABS: 500.0000 mg | ORAL_TABLET | Freq: Two times a day (BID) | ORAL | 0 refills | Status: DC

## 2020-04-26 MED ORDER — DOXYCYCLINE HYCLATE 100 MG PO TABS: 100.0000 mg | ORAL_TABLET | Freq: Two times a day (BID) | ORAL | 0 refills | Status: AC

## 2020-04-26 MED ORDER — CEFTRIAXONE SODIUM 500 MG IJ SOLR: 500.0000 mg | Freq: Once | INTRAMUSCULAR | Status: DC

## 2020-04-26 MED ORDER — METRONIDAZOLE 500 MG PO TABS: 500.0000 mg | ORAL_TABLET | Freq: Two times a day (BID) | ORAL | 0 refills | Status: AC

## 2020-04-26 MED ORDER — DOXYCYCLINE HYCLATE 100 MG PO TABS: 100.0000 mg | ORAL_TABLET | Freq: Two times a day (BID) | ORAL | 0 refills | Status: DC

## 2020-04-29 NOTE — Progress Notes (Signed)
Attestation of Attending Supervision of Enhanced Role Nurse:  At public health departments, Enhanced Role Nurses work under standing orders and procedures to provide STI related screening services.. I agree with the care provided to this patient and was available for any consultation as needed.  I have reviewed the RN's note and chart.  I was not consulted by the RN for the patient. If consulted documentation of consult is reflected in the RN's documentation.  ° °Kaci Freel Niles Angeni Chaudhuri, MD, MPH, ABFM °Medical Director  °[facility].  ° °

## 2020-05-18 ENCOUNTER — Ambulatory Visit: Payer: Medicaid Other

## 2020-05-19 ENCOUNTER — Ambulatory Visit: Payer: Medicaid Other

## 2020-06-08 ENCOUNTER — Ambulatory Visit: Payer: Self-pay | Admitting: Advanced Practice Midwife

## 2020-06-08 ENCOUNTER — Other Ambulatory Visit: Payer: Self-pay

## 2020-06-08 DIAGNOSIS — Z113 Encounter for screening for infections with a predominantly sexual mode of transmission: Secondary | ICD-10-CM

## 2020-06-08 DIAGNOSIS — N730 Acute parametritis and pelvic cellulitis: Secondary | ICD-10-CM

## 2020-06-08 DIAGNOSIS — Z720 Tobacco use: Secondary | ICD-10-CM

## 2020-06-08 LAB — WET PREP FOR TRICH, YEAST, CLUE
Trichomonas Exam: NEGATIVE
Yeast Exam: NEGATIVE

## 2020-06-08 NOTE — Progress Notes (Signed)
[facility] Department STI clinic/screening visit  Subjective:  Priscilla Farmer is a 24 y.o. SBF nullip vaper female being seen today for an STI screening visit. The patient reports they do have symptoms.  Patient reports that they not sure if desire a pregnancy in the next year.   They reported they are not interested in discussing contraception today.  Patient's last menstrual period was 05/16/2020 (approximate).   Patient has the following medical conditions:   Patient Active Problem List   Diagnosis Date Noted  . PID (acute pelvic inflammatory disease) with hospitalization 04/23/20-04/26/20 06/08/2020  . Suicide attempt (HCC) 04/23/20 OD 09/24/2015  . Depression 09/24/2015  . Overdose on Bentyl with hospitalization x4 days 04/23/20 09/24/2015    Chief Complaint  Patient presents with  . Pelvic Pain    HPI  Patient reports sl malodor and "tense feeling in my belly when I poop or pass gas like I felt when I had gonorrhea".  Pt diagnosed with GC on 04/15/20 and treated on 04/21/20 for PID and GC, went to ER with PID on 04/23/20-04/26/20 and was given IV antibiotics Cefoxitin, Doxyclycline, Metronidazole. Also admitted with suicide attempt (OD) that day and had psych consult on 04/25/20 and recommended therapy, which pt has gone once for therapy at her school [employer]).  Last sex 06/04/20 without condom; with current partner x 22 mo.  1 partner in last 3 mo.  Last MJ 2020.  Last ETOH last night (2 mixed drinks) 2-3x/mo.  Vapes.  LMP 05/16/20.  Not sure if she wants a pregnancy in next 12 mo.   Last HIV test per patient/review of record was pt doesn't know Patient reports last pap was pt doesn't know  See flowsheet for further details and programmatic requirements.    The following portions of the patient's history were reviewed and updated as appropriate: allergies, current medications, past medical history, past social history, past surgical history and problem list.  Objective:   There were no vitals filed for this visit.  Physical Exam Vitals and nursing note reviewed.  Constitutional:      Appearance: Normal appearance.  HENT:     Head: Normocephalic and atraumatic.     Mouth/Throat:     Mouth: Mucous membranes are moist.     Pharynx: Oropharynx is clear. No oropharyngeal exudate or posterior oropharyngeal erythema.  Eyes:     Conjunctiva/sclera: Conjunctivae normal.  Pulmonary:     Effort: Pulmonary effort is normal.  Abdominal:     General: Abdomen is flat.     Palpations: Abdomen is soft. There is no mass.     Tenderness: There is no abdominal tenderness. There is no rebound.     Comments: Soft without masses, tenderness, guarding, grimacing  Genitourinary:    General: Normal vulva.     Exam position: Lithotomy position.     Pubic Area: No rash or pubic lice.      Labia:        Right: No rash or lesion.        Left: No rash or lesion.      Vagina: Vaginal discharge (white creamy leukorrhea, without malodor, ph<4.5) present. No erythema, bleeding or lesions.     Cervix: No cervical motion tenderness, friability, erythema or eversion.     Uterus: Normal.      Adnexa: Right adnexa normal and left adnexa normal.       Right: No mass, tenderness or fullness.         Left: No mass,  tenderness or fullness.       Rectum: Normal.  Lymphadenopathy:     Head:     Right side of head: No preauricular or posterior auricular adenopathy.     Left side of head: No preauricular or posterior auricular adenopathy.     Cervical: No cervical adenopathy.     Upper Body:     Right upper body: No supraclavicular or axillary adenopathy.     Left upper body: No supraclavicular or axillary adenopathy.     Lower Body: No right inguinal adenopathy. No left inguinal adenopathy.  Skin:    General: Skin is warm and dry.     Findings: No rash.  Neurological:     Mental Status: She is alert and oriented to person, place, and time.      Assessment and Plan:  Priscilla Farmer is a 24 y.o. female presenting to the [facility] Department for STI screening  1. Screening examination for venereal disease Treat wet mount per standing orders Immunization nurse consult  - WET PREP FOR TRICH, YEAST, CLUE - Chlamydia/Gonorrhea [facility] - Gonococcus culture  2. PID (acute pelvic inflammatory disease) with hospitalization 04/23/20-04/26/20 Pt states she received IV antibiotics in hospital with po after discharge and was compliant.  States she gave contact card to her partner and he told her he got the medicine     No follow-ups on file.  No future appointments.  Alberteen Spindle, CNM

## 2020-06-08 NOTE — Progress Notes (Signed)
Presents for STD screen. Reports symptoms today. Declines bloodwork. Results reviewed with provider, will treat per standing order. Medication dispensed, pt counseled on taking medication appropriately. Verbalizes understanding. Sharlyne Pacas, RN

## 2020-06-14 ENCOUNTER — Ambulatory Visit: Payer: Self-pay | Admitting: Physician Assistant

## 2020-06-14 ENCOUNTER — Other Ambulatory Visit: Payer: Self-pay

## 2020-06-14 DIAGNOSIS — A5602 Chlamydial vulvovaginitis: Secondary | ICD-10-CM

## 2020-06-14 DIAGNOSIS — Z113 Encounter for screening for infections with a predominantly sexual mode of transmission: Secondary | ICD-10-CM

## 2020-06-14 LAB — GONOCOCCUS CULTURE

## 2020-06-14 NOTE — Progress Notes (Signed)
Here today "just to have a pelvic exam." Reports she was recently treated for PID on 04/23/2020 but could not take medication and went to [facility] for repeat treatment. States she is having pelvic pain similar to pain she experienced with PID. Tawny Hopping, RN

## 2020-06-15 ENCOUNTER — Telehealth: Payer: Self-pay

## 2020-06-15 NOTE — Telephone Encounter (Signed)
Call to patient. Patient notified of positive chlamydia from 06/08/2020 visit. Patient reports she was treated with azithromycin yesterday in the office. No further questions.  Harvie Heck, RN

## 2020-06-16 ENCOUNTER — Encounter: Payer: Self-pay | Admitting: Physician Assistant

## 2020-06-16 DIAGNOSIS — A5602 Chlamydial vulvovaginitis: Secondary | ICD-10-CM

## 2020-06-16 MED ORDER — AZITHROMYCIN 500 MG PO TABS
1000.0000 mg | ORAL_TABLET | Freq: Once | ORAL | Status: AC
  Administered 2020-06-16: 1000 mg via ORAL

## 2020-06-16 MED ORDER — AZITHROMYCIN 500 MG PO TABS: 500.0000 mg | ORAL_TABLET | Freq: Once | ORAL | 0 refills | Status: AC

## 2020-06-16 NOTE — Progress Notes (Signed)
S:  Patient into clinic today for a recheck.  States that she has started having symptoms similar to those she started having prior to being diagnosed with PID a couple of months ago.  States that she was treated for PID in August but was not able to tolerate po antibiotics and was hospitalized at [facility] for IV antibiotics.  States that she was given promethazine for nausea and has been taking that as needed since nausea has persisted.  Patient had a visit with [facility] on 06/08/2020, and was screened again then.  Reports that this am she started having "stomach pains and tight feeling".  Denies fever, chills, changes to bowel and bladder habits.  States last sex was last week with partner she has had for "a while".  Last period was 05/16/2020.   O:  WDWN female in NAD, A&O x 3, normal work of breathing.  Abdominal exam= soft, no masses, diffuse tenderness to deep palpation all four quadrants; bimanual= uterus firm, mobile, nt, no masses, no CMT, no adnexal tenderness or fullness. GC results from 06/08/2020= negative Chlamydia results from 06/08/2020= positive. A/P:  1.  Patient with symptoms and positive test for Chlamydia.  Chlamydia needs treatment. 2.  Will treat with Azithromycin 1 g po DOT today.  Will also give patient a dose of Azithromycin to take home to retake if vomits < 2 hr after taking or in 1 week to make sure Chlamydia has cleared. The patient was dispensed Azithromycin  today. I provided counseling today regarding the medication. We discussed the medication, the side effects and when to call clinic. Patient given the opportunity to ask questions. Questions answered.  3.  No sex for 7 days and until after partner completes treatment. 4.  Condoms with all sex. 5.  RTC in 3 months for re-screening and prn.

## 2020-06-21 ENCOUNTER — Telehealth: Payer: Medicaid Other | Admitting: Physician Assistant

## 2020-06-21 DIAGNOSIS — R112 Nausea with vomiting, unspecified: Secondary | ICD-10-CM

## 2020-06-21 MED ORDER — PROMETHAZINE HCL 25 MG PO TABS
25.0000 mg | ORAL_TABLET | Freq: Two times a day (BID) | ORAL | 0 refills | Status: DC | PRN
Start: 2020-06-21 — End: 2022-10-03

## 2020-06-21 NOTE — Progress Notes (Signed)

## 2020-06-26 ENCOUNTER — Telehealth: Payer: Medicaid Other | Admitting: Physician Assistant

## 2020-06-26 ENCOUNTER — Encounter: Payer: Self-pay | Admitting: Physician Assistant

## 2020-06-26 DIAGNOSIS — R197 Diarrhea, unspecified: Secondary | ICD-10-CM

## 2020-06-26 MED ORDER — AZITHROMYCIN 500 MG PO TABS
500.0000 mg | ORAL_TABLET | Freq: Every day | ORAL | 0 refills | Status: DC
Start: 2020-06-26 — End: 2021-01-09

## 2020-06-26 NOTE — Progress Notes (Signed)
We are sorry that you are not feeling well.  Here is how we plan to help!  Based on what you have shared with me it looks like you have Acute Infectious Diarrhea.  Most cases of acute diarrhea are due to infections with virus and bacteria and are self-limited conditions lasting less than 14 days.  For your symptoms you may take Imodium 2 mg tablets that are over the counter at your local pharmacy. Take two tablet now and then one after each loose stool up to 6 a day.  Antibiotics are not needed for most people with diarrhea.    Optional: I have prescribed azithromycin 500 mg daily for 3 days . Review of your records shows that you had nausea and vomiting approx 1 week ago. This diarrhea could be related to your nausea and vomiting and could be a results of virus in your stomach. You have stated that your nausea and vomiting have subsided. Stay hydrated to avoid dehydration.     HOME CARE  We recommend changing your diet to help with your symptoms for the next few days.  Drink plenty of fluids that contain water salt and sugar. Sports drinks such as Gatorade may help.   You may try broths, soups, bananas, applesauce, soft breads, mashed potatoes or crackers.   You are considered infectious for as long as the diarrhea continues. Hand washing or use of alcohol based hand sanitizers is recommend.  It is best to stay out of work or school until your symptoms stop.   GET HELP RIGHT AWAY  If you have dark yellow colored urine or do not pass urine frequently you should drink more fluids.    If your symptoms worsen   If you feel like you are going to pass out (faint)  You have a new problem  MAKE SURE YOU   Understand these instructions.  Will watch your condition.  Will get help right away if you are not doing well or get worse.  Your e-visit answers were reviewed by a board certified advanced clinical practitioner to complete your personal care plan.  Depending on the  condition, your plan could have included both over the counter or prescription medications.  If there is a problem please reply  once you have received a response from your provider.  Your safety is important to [place].  If you have drug allergies check your prescription carefully.    You can use MyChart to ask questions about today's visit, request a non-urgent call back, or ask for a work or school excuse for 24 hours related to this e-Visit. If it has been greater than 24 hours you will need to follow up with your provider, or enter a new e-Visit to address those concerns.   You will get an e-mail in the next two days asking about your experience.  I hope that your e-visit has been valuable and will speed your recovery. Thank you for using e-visits.  I spent 5-10 minutes on review and completion of this note- Illa Level [facility]

## 2020-09-02 ENCOUNTER — Telehealth: Payer: Medicaid Other | Admitting: Nurse Practitioner

## 2020-09-02 DIAGNOSIS — B373 Candidiasis of vulva and vagina: Secondary | ICD-10-CM

## 2020-09-02 DIAGNOSIS — B3731 Acute candidiasis of vulva and vagina: Secondary | ICD-10-CM

## 2020-09-02 MED ORDER — FLUCONAZOLE 150 MG PO TABS: 150.0000 mg | ORAL_TABLET | Freq: Once | ORAL | 0 refills | Status: AC

## 2020-09-02 NOTE — Progress Notes (Signed)

## 2020-09-04 ENCOUNTER — Telehealth: Payer: Medicaid Other | Admitting: Nurse Practitioner

## 2020-09-04 DIAGNOSIS — N760 Acute vaginitis: Secondary | ICD-10-CM

## 2020-09-04 DIAGNOSIS — B9689 Other specified bacterial agents as the cause of diseases classified elsewhere: Secondary | ICD-10-CM

## 2020-09-04 MED ORDER — METRONIDAZOLE 500 MG PO TABS: 500.0000 mg | ORAL_TABLET | Freq: Two times a day (BID) | ORAL | 0 refills | Status: AC

## 2020-09-04 NOTE — Progress Notes (Signed)

## 2020-10-03 ENCOUNTER — Telehealth: Payer: Medicaid Other | Admitting: Family

## 2020-10-03 DIAGNOSIS — N898 Other specified noninflammatory disorders of vagina: Secondary | ICD-10-CM

## 2020-10-03 NOTE — Progress Notes (Signed)
Based on what you shared with me, I feel your condition warrants further evaluation and I recommend that you be seen for a face to face office visit.   Given you have recurrent vaginal discharge, you need to be seen face to face to rule out a more serious infection.    NOTE: If you entered your credit card information for this eVisit, you will not be charged. You may see a "hold" on your card for the $35 but that hold will drop off and you will not have a charge processed.   If you are having a true medical emergency please call 911.      For an urgent face to face visit, [place] has five urgent care centers for your convenience:     [facility] at [facility] Directions [phone] [address] [place], [place] [address] . 10 am - 6pm Monday - Friday    [facility] Health Urgent Care Center [facility]) Get Driving Directions [phone] [address] [place], [place] [address] . 10 am to 8 pm Monday-Friday . 12 pm to 8 pm [facility] Urgent Care at [facility] Get Driving Directions [phone] [address] [place], [place] [address] . 8 am to 8 pm Monday-Friday . 9 am to 6 pm Saturday . 11 am to 6 pm Sunday     [facility] Health Urgent Care at [facility] Get Driving Directions  [phone] [address] [place], [place] [address] . 8 am to 8 pm Monday-Friday . 8 am to 4 pm [facility] Urgent Care at [facility] Directions [phone] [address] [place], [place] [address] . 12 pm to 6 pm Monday-Friday      Your e-visit answers were reviewed by a board certified advanced clinical practitioner to complete your personal care plan.  Thank you for using e-Visits.

## 2020-10-14 ENCOUNTER — Ambulatory Visit: Payer: Medicaid Other

## 2020-10-21 ENCOUNTER — Telehealth: Payer: Medicaid Other | Admitting: Emergency Medicine

## 2020-10-21 DIAGNOSIS — N898 Other specified noninflammatory disorders of vagina: Secondary | ICD-10-CM

## 2020-10-21 NOTE — Progress Notes (Signed)
Based on what you shared with me, it looks like you have a recurrent infection. You will need to be evaluated in-person so that swabs and cultures can be obtained.  I'm sorry, we won't be able to adequately diagnose or treat your condition through and e-visit.    NOTE: If you entered your credit card information for this eVisit, you will not be charged. You may see a "hold" on your card for the $35 but that hold will drop off and you will not have a charge processed.   If you are having a true medical emergency please call 911.      For an urgent face to face visit, [place] has five urgent care centers for your convenience:     [facility] Ur[facility] Center at [facility] Directions [phone] [address] [place], [place] [address] . 10 am - 6pm Monday - Friday    [facility] Health Urgent Care Center [facility]) Get Driving Directions [phone] [address] [place], [place] [address] . 10 am to 8 pm Monday-Friday . 12 pm to 8 pm [facility] Urgent Care at [facility] Get Driving Directions [phone] [address] [place], [place] [address] . 8 am to 8 pm Monday-Friday . 9 am to 6 pm Saturday . 11 am to 6 pm Sunday     [facility] Health Urgent Care at [facility] Get Driving Directions  [phone] [address] [place], [place] [address] . 8 am to 8 pm Monday-Friday . 8 am to 4 pm [facility] Urgent Care at [facility] Directions [phone] [address] [place], [place] [address] . 12 pm to 6 pm Monday-Friday      Your e-visit answers were reviewed by a board certified advanced clinical practitioner to complete your personal care plan.  Thank you for using e-Visits.    Approximately 5 minutes was used in reviewing the patient's chart, questionnaire, prescribing medications, and documentation.

## 2020-10-28 ENCOUNTER — Ambulatory Visit: Payer: Self-pay | Admitting: Physician Assistant

## 2020-10-28 ENCOUNTER — Encounter: Payer: Self-pay | Admitting: Physician Assistant

## 2020-10-28 ENCOUNTER — Other Ambulatory Visit: Payer: Self-pay

## 2020-10-28 DIAGNOSIS — B9689 Other specified bacterial agents as the cause of diseases classified elsewhere: Secondary | ICD-10-CM

## 2020-10-28 DIAGNOSIS — Z113 Encounter for screening for infections with a predominantly sexual mode of transmission: Secondary | ICD-10-CM

## 2020-10-28 DIAGNOSIS — N760 Acute vaginitis: Secondary | ICD-10-CM

## 2020-10-28 LAB — WET PREP FOR TRICH, YEAST, CLUE
Trichomonas Exam: NEGATIVE
Yeast Exam: NEGATIVE

## 2020-10-28 MED ORDER — METRONIDAZOLE 500 MG PO TABS: 500.0000 mg | ORAL_TABLET | Freq: Two times a day (BID) | ORAL | 0 refills | Status: AC

## 2020-10-28 NOTE — Progress Notes (Signed)
Chart reviewed by Pharmacist  Suzanne Walker PharmD, Contract Pharmacist at [facility]  

## 2020-10-28 NOTE — Progress Notes (Signed)
  [facility] Department STI clinic/screening visit  Subjective:  Priscilla Farmer is a 25 y.o. female being seen today for an STI screening visit. The patient reports they do have symptoms.  Patient reports that they do not desire a pregnancy in the next year.   They reported they are not interested in discussing contraception today.  No LMP recorded.   Patient has the following medical conditions:   Patient Active Problem List   Diagnosis Date Noted  . PID (acute pelvic inflammatory disease) with hospitalization 04/23/20-04/26/20 06/08/2020  . Vapes nicotine containing substance 06/08/2020  . Suicide attempt (HCC) 04/23/20 OD 09/24/2015  . Depression with psych consult 04/25/20 in hospital 09/24/2015  . Overdose on Bentyl with hospitalization x4 days 04/23/20 09/24/2015    Chief Complaint  Patient presents with  . SEXUALLY TRANSMITTED DISEASE    screening    HPI  Patient reports that she has had yellow discharge and odor for 1 week.  States that she is currently taking an antibiotic for an UTI.  Denies chronic conditions, surgeries and regular medicines.  Reports last HIV test was in 2021, that she has not had a pap yet and LMP was 10/15/2019.   See flowsheet for further details and programmatic requirements.    The following portions of the patient's history were reviewed and updated as appropriate: allergies, current medications, past medical history, past social history, past surgical history and problem list.  Objective:  There were no vitals filed for this visit.  Physical Exam Constitutional:      General: She is not in acute distress.    Appearance: Normal appearance.  HENT:     Head: Normocephalic and atraumatic.     Comments: No nits,lice, or hair loss. No cervical, supraclavicular or axillary adenopathy. Eyes:     Conjunctiva/sclera: Conjunctivae normal.  Pulmonary:     Effort: Pulmonary effort is normal.  Skin:    General: Skin is warm and dry.      Findings: No bruising, erythema, lesion or rash.  Neurological:     Mental Status: She is alert and oriented to person, place, and time.  Psychiatric:        Mood and Affect: Mood normal.        Behavior: Behavior normal.        Thought Content: Thought content normal.        Judgment: Judgment normal.      Assessment and Plan:  Priscilla Farmer is a 25 y.o. female presenting to the [facility] Department for STI screening  1. Screening for STD (sexually transmitted disease) Patient into clinic with symptoms. Patient opts to self collect sample for wet mount today.   Patient declines blood work and Tax adviser today.  Rec condoms with all sex. Await test results.  Counseled that RN will call if needs to RTC for treatment once results are back. - WET PREP FOR TRICH, YEAST, CLUE  2. BV (bacterial vaginosis) Will treat for BV with Metronidazole 500 mg #14 1 po BID for 7 days with food, no EtOH for 24 hr before and until 72 hr after completing medicine. Enc to use OTC antifungal cream if has itching during or just after completing medicine. - metroNIDAZOLE (FLAGYL) 500 MG tablet; Take 1 tablet (500 mg total) by mouth 2 (two) times daily for 7 days.  Dispense: 14 tablet; Refill: 0     No follow-ups on file.  No future appointments.  Matt Holmes, PA

## 2021-01-05 ENCOUNTER — Ambulatory Visit: Payer: Medicaid Other

## 2021-01-09 ENCOUNTER — Telehealth: Payer: Medicaid Other | Admitting: Physician Assistant

## 2021-01-09 DIAGNOSIS — N760 Acute vaginitis: Secondary | ICD-10-CM

## 2021-01-09 DIAGNOSIS — B9689 Other specified bacterial agents as the cause of diseases classified elsewhere: Secondary | ICD-10-CM

## 2021-01-09 MED ORDER — METRONIDAZOLE 500 MG PO TABS: 500.0000 mg | ORAL_TABLET | Freq: Two times a day (BID) | ORAL | 0 refills | Status: DC

## 2021-01-09 NOTE — Progress Notes (Signed)
I have spent 5 minutes in review of e-visit questionnaire, review and updating patient chart, medical decision making and response to patient.   Garett Tetzloff Cody Melecio Cueto, PA-C    

## 2021-01-09 NOTE — Progress Notes (Signed)

## 2021-05-28 ENCOUNTER — Telehealth: Payer: Medicaid Other | Admitting: Family Medicine

## 2021-05-28 DIAGNOSIS — B3731 Acute candidiasis of vulva and vagina: Secondary | ICD-10-CM

## 2021-05-29 ENCOUNTER — Telehealth: Payer: Medicaid Other | Admitting: Family Medicine

## 2021-05-29 DIAGNOSIS — B3731 Acute candidiasis of vulva and vagina: Secondary | ICD-10-CM

## 2021-05-29 MED ORDER — FLUCONAZOLE 150 MG PO TABS: 150.0000 mg | ORAL_TABLET | Freq: Once | ORAL | 0 refills | Status: AC

## 2021-05-29 NOTE — Progress Notes (Signed)
Duplicate Evisit   [place]

## 2021-05-29 NOTE — Progress Notes (Signed)

## 2021-06-06 ENCOUNTER — Telehealth: Payer: Medicaid Other | Admitting: Emergency Medicine

## 2021-06-06 DIAGNOSIS — R051 Acute cough: Secondary | ICD-10-CM

## 2021-06-06 MED ORDER — BENZONATATE 100 MG PO CAPS: 100.0000 mg | ORAL_CAPSULE | Freq: Two times a day (BID) | ORAL | 0 refills | Status: DC | PRN

## 2021-06-06 MED ORDER — ALBUTEROL SULFATE HFA 108 (90 BASE) MCG/ACT IN AERS: 2.0000 | INHALATION_SPRAY | RESPIRATORY_TRACT | 3 refills | Status: DC | PRN

## 2021-06-06 NOTE — Progress Notes (Signed)
We are sorry that you are not feeling well.  Here is how we plan to help!  Based on your presentation I believe you most likely have A cough due to a virus.  This is called viral bronchitis and is best treated by rest, plenty of fluids and control of the cough.  You may use Ibuprofen or Tylenol as directed to help your symptoms.     In addition you may use A prescription cough medication called Tessalon Perles 100mg . You may take 1-2 capsules every 8 hours as needed for your cough.  I've also sent in and inhaler.  Take as directed.  From your responses in the eVisit questionnaire you describe inflammation in the upper respiratory tract which is causing a significant cough.  This is commonly called Bronchitis and has four common causes:   Allergies Viral Infections Acid Reflux Bacterial Infection Allergies, viruses and acid reflux are treated by controlling symptoms or eliminating the cause. An example might be a cough caused by taking certain blood pressure medications. You stop the cough by changing the medication. Another example might be a cough caused by acid reflux. Controlling the reflux helps control the cough.  USE OF BRONCHODILATOR ("RESCUE") INHALERS: There is a risk from using your bronchodilator too frequently.  The risk is that over-reliance on a medication which only relaxes the muscles surrounding the breathing tubes can reduce the effectiveness of medications prescribed to reduce swelling and congestion of the tubes themselves.  Although you feel brief relief from the bronchodilator inhaler, your asthma may actually be worsening with the tubes becoming more swollen and filled with mucus.  This can delay other crucial treatments, such as oral steroid medications. If you need to use a bronchodilator inhaler daily, several times per day, you should discuss this with your provider.  There are probably better treatments that could be used to keep your asthma under control.     HOME  CARE Only take medications as instructed by your medical team. Complete the entire course of an antibiotic. Drink plenty of fluids and get plenty of rest. Avoid close contacts especially the very young and the elderly Cover your mouth if you cough or cough into your sleeve. Always remember to wash your hands A steam or ultrasonic humidifier can help congestion.   GET HELP RIGHT AWAY IF: You develop worsening fever. You become short of breath You cough up blood. Your symptoms persist after you have completed your treatment plan MAKE SURE YOU  Understand these instructions. Will watch your condition. Will get help right away if you are not doing well or get worse.    Thank you for choosing an e-visit.  Your e-visit answers were reviewed by a board certified advanced clinical practitioner to complete your personal care plan. Depending upon the condition, your plan could have included both over the counter or prescription medications.  Please review your pharmacy choice. Make sure the pharmacy is open so you can pick up prescription now. If there is a problem, you may contact your provider through and have the prescription routed to another pharmacy.  Your safety is important to [employer]. If you have drug allergies check your prescription carefully.   For the next 24 hours you can use MyChart to ask questions about today's visit, request a non-urgent call back, or ask for a work or school excuse. You will get an email in the next two days asking about your experience. I hope that your e-visit has been valuable  and will speed your recovery.  Approximately 5 minutes was used in reviewing the patient's chart, questionnaire, prescribing medications, and documentation.

## 2021-06-13 ENCOUNTER — Telehealth: Payer: Medicaid Other | Admitting: Physician Assistant

## 2021-06-13 DIAGNOSIS — N760 Acute vaginitis: Secondary | ICD-10-CM

## 2021-06-13 MED ORDER — METRONIDAZOLE 500 MG PO TABS: 500.0000 mg | ORAL_TABLET | Freq: Two times a day (BID) | ORAL | 0 refills | Status: AC

## 2021-06-13 NOTE — Progress Notes (Signed)

## 2021-08-31 IMAGING — US US PELVIS COMPLETE TRANSABD/TRANSVAG W DUPLEX
2 series · 13 of 25 positions shown · non-contrast
Comparison: None.

CLINICAL DATA: 23-year-old female with left lower quadrant pain.

EXAM:
TRANSABDOMINAL AND TRANSVAGINAL ULTRASOUND OF PELVIS
DOPPLER ULTRASOUND OF OVARIES
TECHNIQUE: Both transabdominal and transvaginal ultrasound examinations of the
pelvis were performed. Transabdominal technique was performed for
global imaging of the pelvis including uterus, ovaries, adnexal
regions, and pelvic cul-de-sac.
It was necessary to proceed with endovaginal exam following the
transabdominal exam to visualize the endometrium and ovaries. Color
and duplex Doppler ultrasound was utilized to evaluate blood flow to
the ovaries.

[Series 1: us pelvic complete w transvaginal and torsion righ · 12 of 69 slices shown]
[im 1/69]
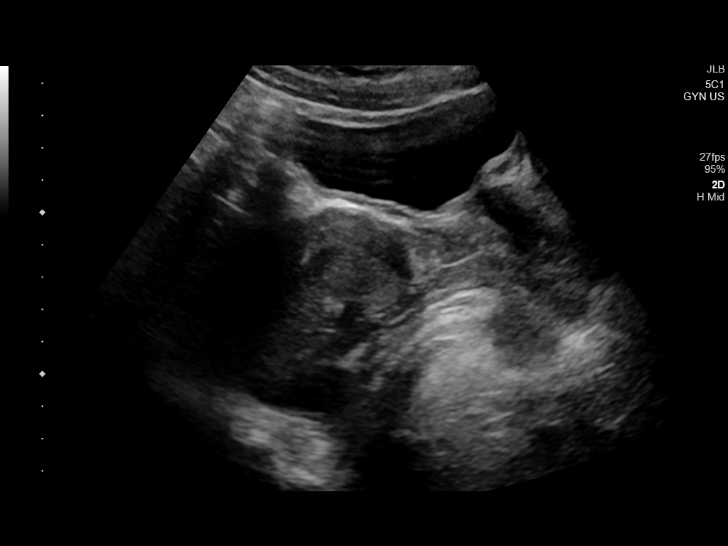
[im 6/69]
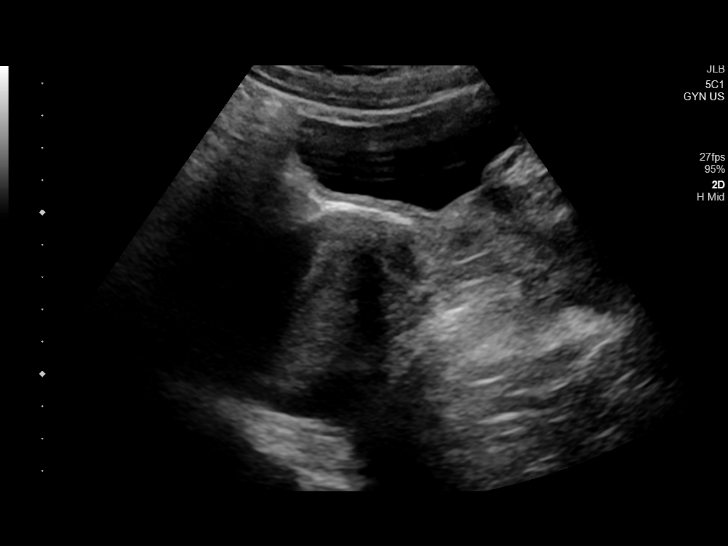
[im 12/69]
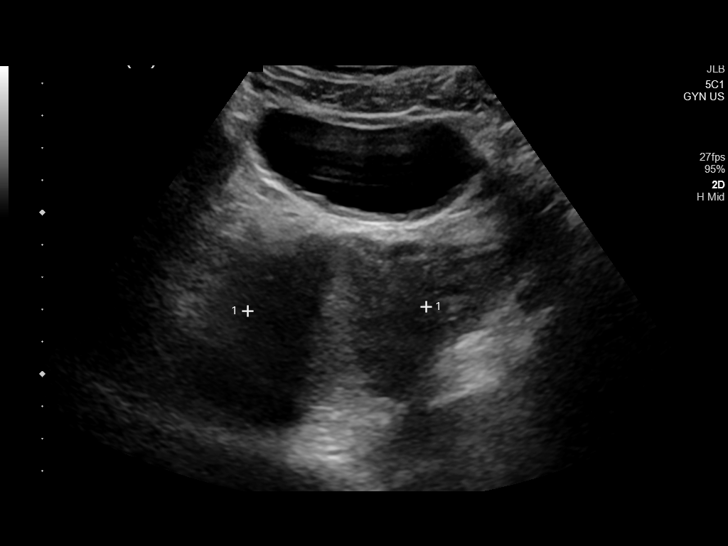
[im 18/69]
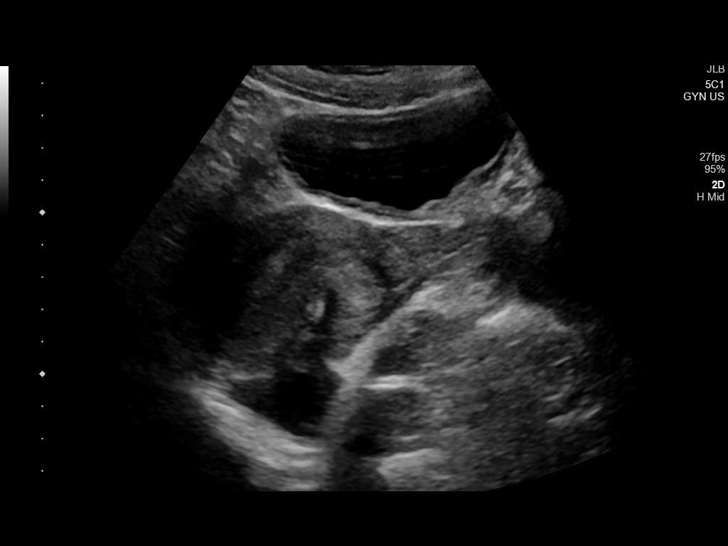
[im 24/69]
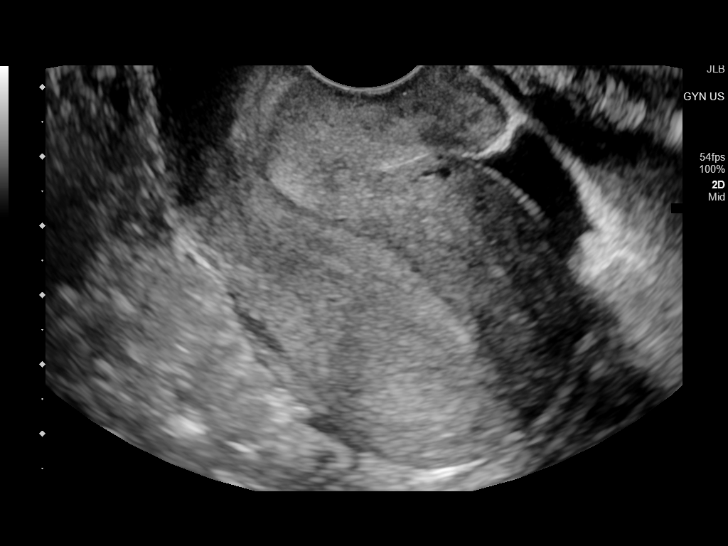
[im 30/69]
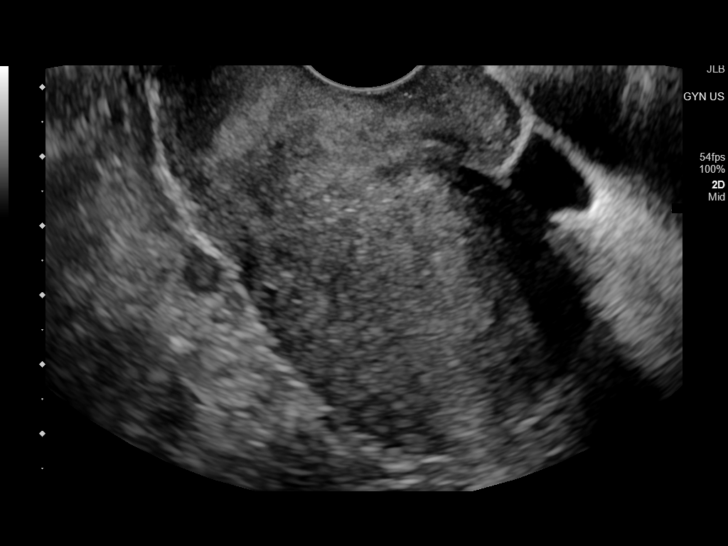
[im 36/69]
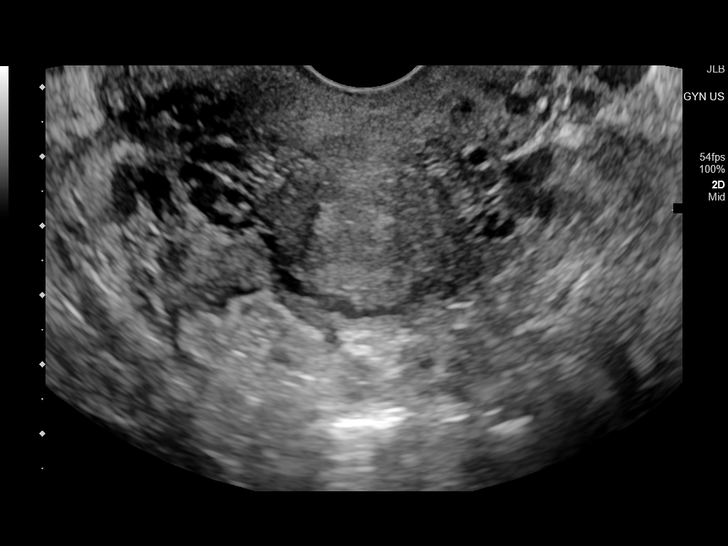
[im 42/69]
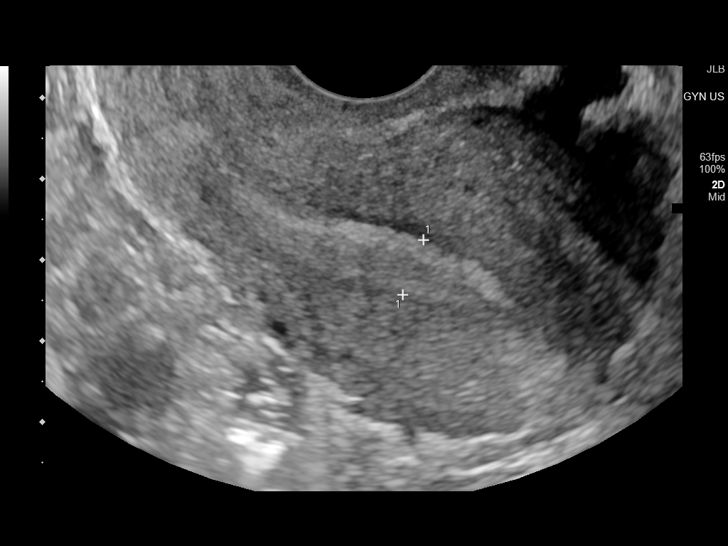
[im 48/69]
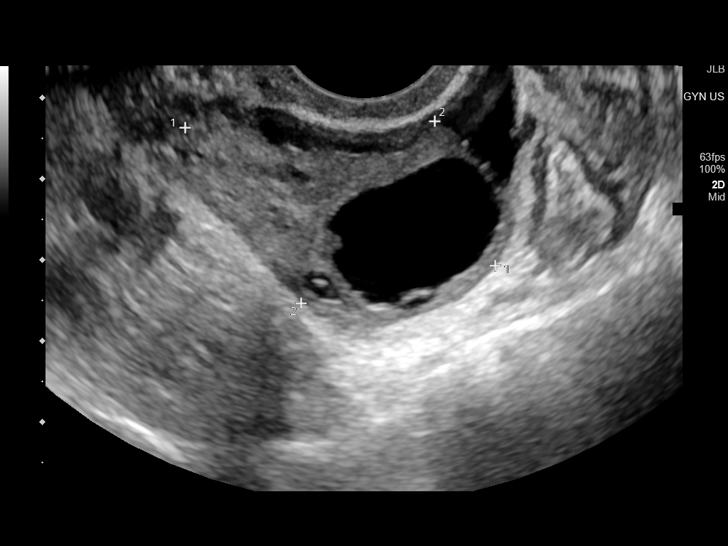
[im 54/69]
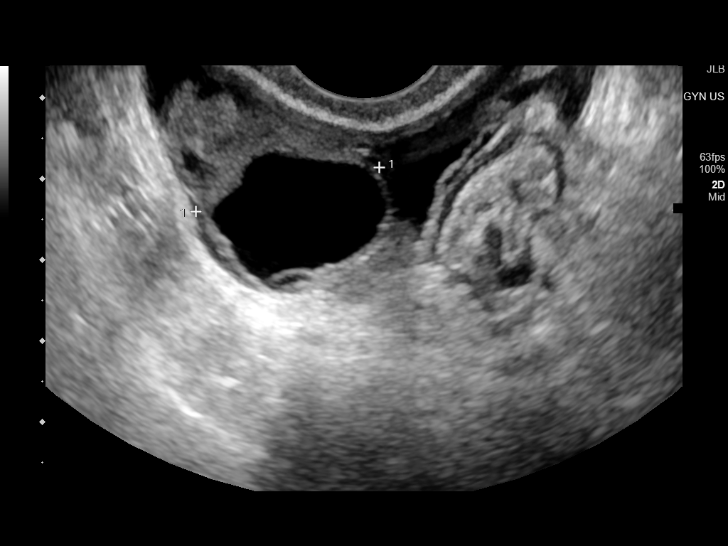
[im 60/69]
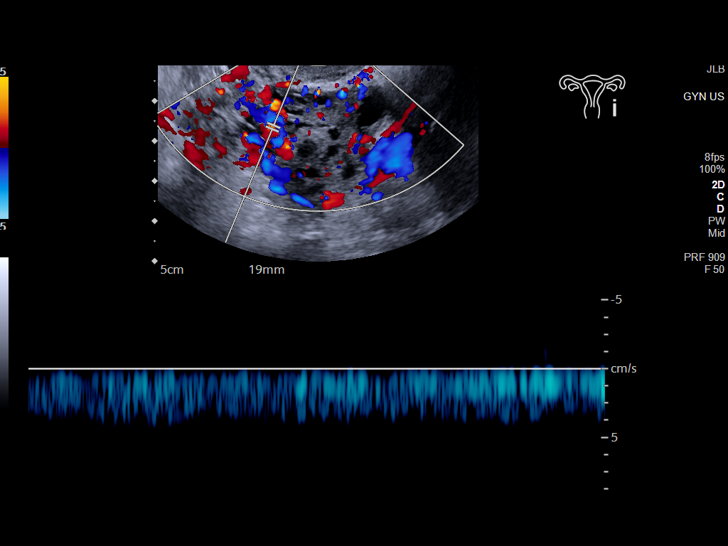
[im 66/69]
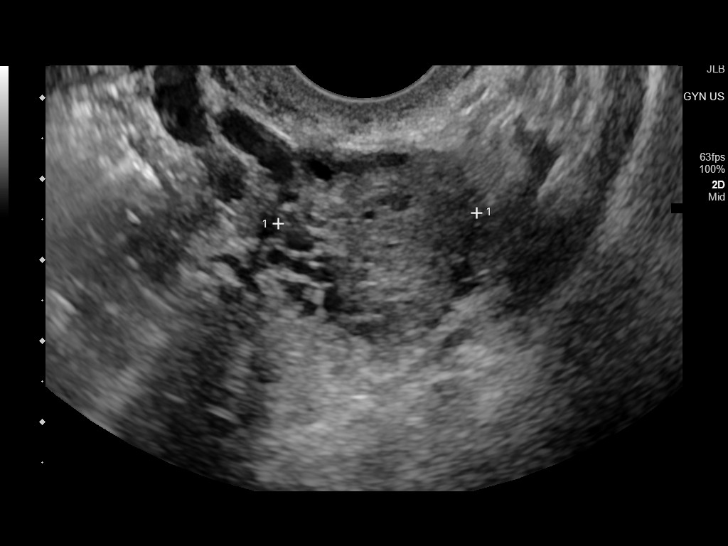

[Series 1001: gyn us · 1 of 2 slices shown]
[im 1/2]
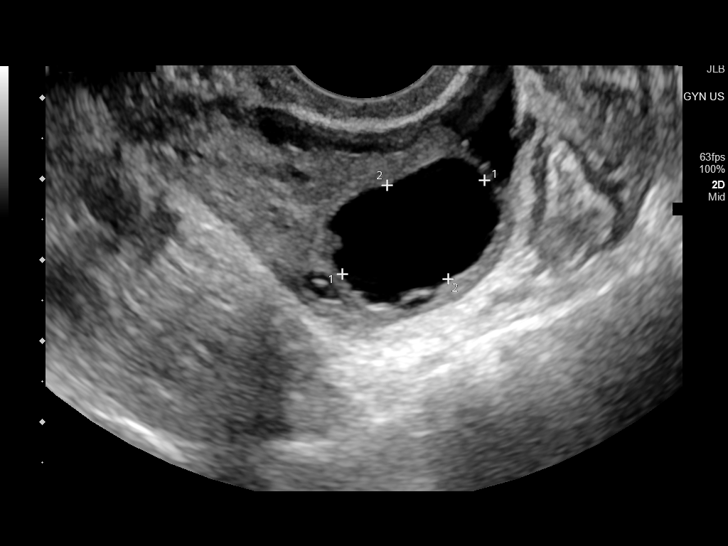

[13 of 25 positions shown; findings below may reference images not displayed]

FINDINGS: Uterus

Measurements: 7.2 x 4.0 x 5.4 cm = volume: 81 mL. No fibroids or
other mass visualized.

Endometrium

Thickness: 7 mm.  No focal abnormality visualized.

Right ovary

Measurements: 4.2 x 2.8 x 2.4 cm = volume: 14 mL. Normal
appearance/no adnexal mass. There is a 2 cm dominant follicle or
cyst.

Left ovary

Measurements: 3.2 x 2.7 x 2.5 cm = volume: 11 mL. Normal
appearance/no adnexal mass.

Pulsed Doppler evaluation of both ovaries demonstrates normal
low-resistance arterial and venous waveforms.

Other findings

No abnormal free fluid.
IMPRESSION: Unremarkable pelvic ultrasound.

## 2021-08-31 IMAGING — DX DG ABDOMEN 1V
1 series · 1 of 1 positions shown · non-contrast
Comparison: None.

CLINICAL DATA: Left lower quadrant pain for 2 days.

EXAM:
ABDOMEN - 1 VIEW

[abdomen supine]
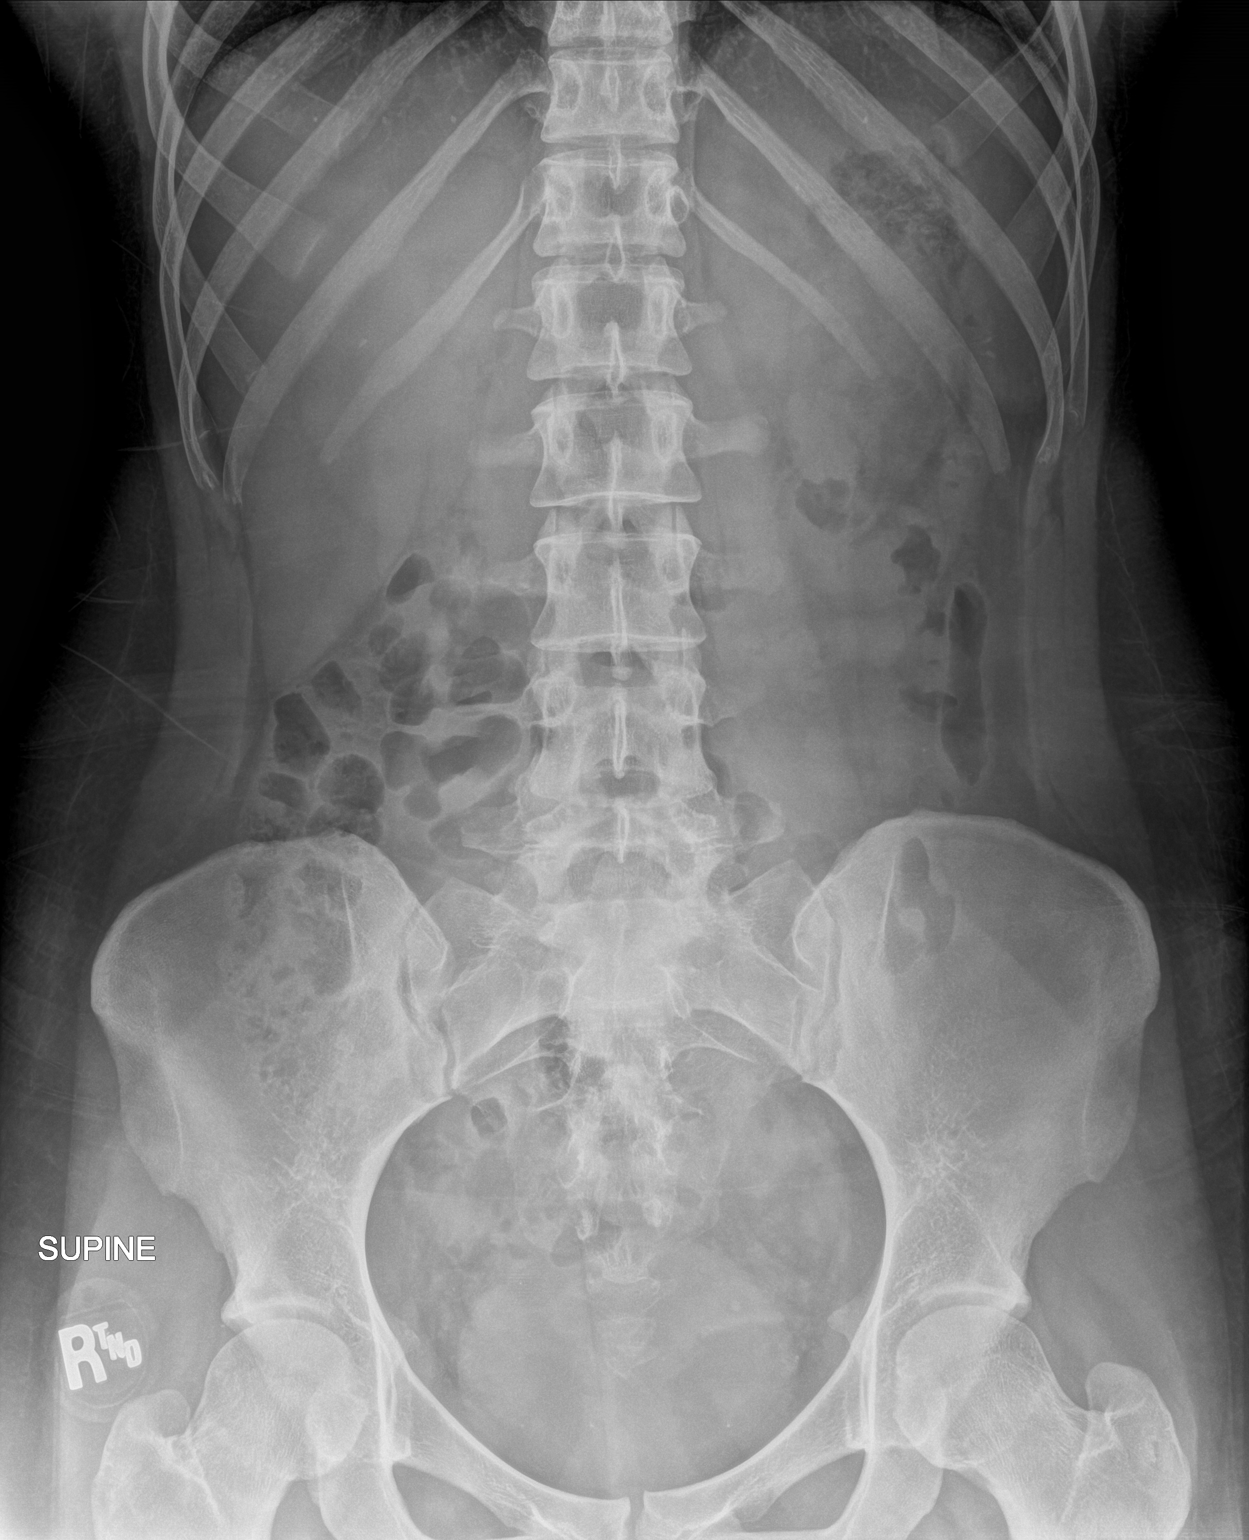

[1 of 1 positions shown; findings below may reference images not displayed]

FINDINGS: Normal bowel gas pattern. No significant increase in the colonic
stool burden.

No evidence of renal or ureteral stones. Soft tissues are
unremarkable. Normal skeletal structures.
IMPRESSION: Negative.

## 2021-09-19 ENCOUNTER — Telehealth: Payer: Medicaid Other | Admitting: Physician Assistant

## 2021-09-19 DIAGNOSIS — N760 Acute vaginitis: Secondary | ICD-10-CM

## 2021-09-19 DIAGNOSIS — B9689 Other specified bacterial agents as the cause of diseases classified elsewhere: Secondary | ICD-10-CM

## 2021-09-19 MED ORDER — METRONIDAZOLE 500 MG PO TABS: 500.0000 mg | ORAL_TABLET | Freq: Two times a day (BID) | ORAL | 0 refills | Status: DC

## 2021-09-19 NOTE — Progress Notes (Signed)
I have spent 5 minutes in review of e-visit questionnaire, review and updating patient chart, medical decision making and response to patient.   Trew Sunde Cody Bowden Boody, PA-C    

## 2021-09-19 NOTE — Progress Notes (Signed)

## 2021-10-16 ENCOUNTER — Telehealth: Payer: Medicaid Other | Admitting: Physician Assistant

## 2021-10-16 DIAGNOSIS — K529 Noninfective gastroenteritis and colitis, unspecified: Secondary | ICD-10-CM

## 2021-10-16 MED ORDER — ONDANSETRON 4 MG PO TBDP: 4.0000 mg | ORAL_TABLET | Freq: Three times a day (TID) | ORAL | 0 refills | Status: AC | PRN

## 2021-10-16 NOTE — Progress Notes (Signed)

## 2021-10-16 NOTE — Progress Notes (Signed)
I have spent 5 minutes in review of e-visit questionnaire, review and updating patient chart, medical decision making and response to patient.   Lonza Shimabukuro Cody Gearl Baratta, PA-C    

## 2021-12-20 ENCOUNTER — Telehealth: Payer: Medicaid Other | Admitting: Physician Assistant

## 2021-12-20 DIAGNOSIS — B9689 Other specified bacterial agents as the cause of diseases classified elsewhere: Secondary | ICD-10-CM

## 2021-12-20 DIAGNOSIS — N760 Acute vaginitis: Secondary | ICD-10-CM

## 2021-12-21 MED ORDER — METRONIDAZOLE 500 MG PO TABS
500.0000 mg | ORAL_TABLET | Freq: Two times a day (BID) | ORAL | 0 refills | Status: DC
Start: 2021-12-21 — End: 2022-02-20

## 2021-12-21 NOTE — Progress Notes (Signed)

## 2021-12-21 NOTE — Progress Notes (Signed)
I have spent 5 minutes in review of e-visit questionnaire, review and updating patient chart, medical decision making and response to patient.   Jyasia Markoff Cody Kileigh Ortmann, PA-C    

## 2022-02-20 ENCOUNTER — Telehealth: Payer: Medicaid Other | Admitting: Physician Assistant

## 2022-02-20 DIAGNOSIS — N760 Acute vaginitis: Secondary | ICD-10-CM

## 2022-02-20 DIAGNOSIS — B9689 Other specified bacterial agents as the cause of diseases classified elsewhere: Secondary | ICD-10-CM

## 2022-02-20 MED ORDER — METRONIDAZOLE 500 MG PO TABS: 500.0000 mg | ORAL_TABLET | Freq: Two times a day (BID) | ORAL | 0 refills | Status: DC

## 2022-02-20 NOTE — Progress Notes (Signed)

## 2022-03-02 ENCOUNTER — Telehealth: Payer: Medicaid Other | Admitting: Nurse Practitioner

## 2022-03-02 DIAGNOSIS — B9689 Other specified bacterial agents as the cause of diseases classified elsewhere: Secondary | ICD-10-CM

## 2022-03-03 NOTE — Progress Notes (Signed)
Based on what you shared with me it looks like you have vaginal discharge with fishy odor.,that should be evaluated in a face to face office visit. This appears to be a recurrent issue that has been treated many times in an evisit. You will need a face to face visit for this at this time. You need a pelvic exam and a sample of the discharge taken for a more in depth evaluation, so that proper treatment can be rendered.   NOTE: There will be NO CHARGE for this eVisit   If you are having a true medical emergency please call 911.      For an urgent face to face visit, [place] has six urgent care centers for your convenience:     [facility] Health Ur[facility] Care Center at [facility] Directions [phone] [address] [place], [place] [address]    [facility] Health Urgent Care Center [facility]) Get Driving Directions [phone] [address] [place], [place] [address]  [facility] Health Urgent Care Center [facility]) Get Driving Directions [phone] [address] [place],  [place]  [address]  [facility] Health Urgent Care at [facility] Get Driving Directions [phone] [address] [place], [place] [address]   [facility] Health Urgent Care at [facility] Get Driving Directions  [phone] [address] [place], [place] [address]   [facility] Health Urgent Care at [facility] Directions [phone] [address] [place], [place] [address]  Your MyChart E-visit questionnaire answers were reviewed by a board certified advanced clinical practitioner to complete your personal care plan based on your specific symptoms.  Thank you for using e-Visits.

## 2022-03-15 ENCOUNTER — Ambulatory Visit: Payer: Medicaid Other

## 2022-03-27 ENCOUNTER — Telehealth: Payer: Medicaid Other | Admitting: Physician Assistant

## 2022-03-27 DIAGNOSIS — K5900 Constipation, unspecified: Secondary | ICD-10-CM

## 2022-03-27 DIAGNOSIS — R198 Other specified symptoms and signs involving the digestive system and abdomen: Secondary | ICD-10-CM

## 2022-03-27 NOTE — Progress Notes (Signed)
Because of significant pain and pressure in the rectum, I feel your condition warrants further evaluation and I recommend that you be seen in a face to face visit. You need an examination to examine if there is a fecal blockage versus other cause of symptoms and so the proper treatments can be given.    NOTE: There will be NO CHARGE for this eVisit   If you are having a true medical emergency please call 911.      For an urgent face to face visit, [place] has seven urgent care centers for your convenience:     [facility] Health Urgent Care Center at [facility] Directions [phone] [address] [place], [place] [address]    [facility] Health Urgent Care Center [facility]) Get Driving Directions [phone] [address] [place], [place] [address]  [facility] Health Urgent Care Center [facility]) Get Driving Directions [phone] [address] [place],  [place]  [address]  [facility] Health Urgent Care Center [facility] [employer] Directions  [phone] [address] [place],  [place] [address]   [facility] Health Urgent Care at [facility] Get Driving Directions [phone] [address] [place] [address] [place], [place] [address]   [facility] Health Urgent Care at [facility] Get Driving Directions  [phone] [address] [place], [place] [address]   [facility] Health Urgent Care at [facility] Directions [phone] [address], [address] [place], [place] [address]  Your MyChart E-visit questionnaire answers were reviewed by a board certified advanced clinical practitioner to complete your personal care plan based on your specific symptoms.  Thank you for using e-Visits.

## 2022-03-29 ENCOUNTER — Telehealth: Payer: Medicaid Other | Admitting: Physician Assistant

## 2022-03-29 DIAGNOSIS — N760 Acute vaginitis: Secondary | ICD-10-CM

## 2022-03-29 NOTE — Progress Notes (Signed)
Because of ongoing symptoms despite current treatment, you will need an in-person evaluation with vaginal swabbing for BV/yeast. ,   NOTE: There will be NO CHARGE for this eVisit   If you are having a true medical emergency please call 911.      For an urgent face to face visit, [facility] has seven urgent care centers for your convenience:     [facility] Health Urgent Care Center at [facility] Directions [phone] [address] [place], [place] [address]    [facility] Health Urgent Care Center [facility]) Get Driving Directions [phone] [address] [place], [place] [address]  [facility] Health Urgent Care Center [facility]) Get Driving Directions [phone] [address] [place],  [place]  [address]  [facility] Health Urgent Care Center [facility] - at [employer] Directions  [phone] [id number] [place],  [place] [address]   [facility] Health Urgent Care at [facility] Get Driving Directions [phone] [address] [place] [address] [place], [place] [address]   [facility] Health Urgent Care at [facility] Get Driving Directions  [phone] [address] [place], [place] [address]   [facility] Health Urgent Care at [facility] Directions [phone] [address] [place], [place] [address]  Your MyChart E-visit questionnaire answers were reviewed by a board certified advanced clinical practitioner to complete your personal care plan based on your specific symptoms.  Thank you for using e-Visits.

## 2022-05-30 ENCOUNTER — Ambulatory Visit: Payer: Medicaid Other

## 2022-06-12 ENCOUNTER — Emergency Department (HOSPITAL_COMMUNITY): Payer: Self-pay

## 2022-06-12 ENCOUNTER — Encounter (HOSPITAL_COMMUNITY): Payer: Self-pay | Admitting: *Deleted

## 2022-06-12 ENCOUNTER — Emergency Department (HOSPITAL_COMMUNITY)
Admission: EM | Admit: 2022-06-12 | Discharge: 2022-06-12 | Disposition: A | Discharge status: Home / Self Care | Payer: Self-pay | Attending: Emergency Medicine | Admitting: Emergency Medicine

## 2022-06-12 ENCOUNTER — Other Ambulatory Visit: Payer: Self-pay

## 2022-06-12 DIAGNOSIS — W500XXA Accidental hit or strike by another person, initial encounter: Secondary | ICD-10-CM | POA: Insufficient documentation

## 2022-06-12 DIAGNOSIS — S61303A Unspecified open wound of left middle finger with damage to nail, initial encounter: Secondary | ICD-10-CM | POA: Insufficient documentation

## 2022-06-12 DIAGNOSIS — S61305A Unspecified open wound of left ring finger with damage to nail, initial encounter: Secondary | ICD-10-CM | POA: Insufficient documentation

## 2022-06-12 DIAGNOSIS — S61309A Unspecified open wound of unspecified finger with damage to nail, initial encounter: Secondary | ICD-10-CM

## 2022-06-12 MED ORDER — BACITRACIN ZINC 500 UNIT/GM EX OINT
TOPICAL_OINTMENT | CUTANEOUS | Status: AC
  Filled 2022-06-12: qty 1.8

## 2022-06-12 MED ORDER — HYDROCODONE-ACETAMINOPHEN 5-325 MG PO TABS
1.0000 | ORAL_TABLET | Freq: Once | ORAL | Status: AC
  Administered 2022-06-12: 1 via ORAL
  Filled 2022-06-12: qty 1

## 2022-06-12 MED ORDER — KETOROLAC TROMETHAMINE 10 MG PO TABS: 10.0000 mg | ORAL_TABLET | Freq: Four times a day (QID) | ORAL | 0 refills | Status: DC | PRN

## 2022-06-12 MED ORDER — BACITRACIN ZINC 500 UNIT/GM EX OINT: 1.0000 | TOPICAL_OINTMENT | Freq: Two times a day (BID) | CUTANEOUS | 0 refills | Status: AC | PRN

## 2022-06-12 MED ORDER — LIDOCAINE HCL (PF) 1 % IJ SOLN
10.0000 mL | Freq: Once | INTRAMUSCULAR | Status: AC
  Administered 2022-06-12: 10 mL
  Filled 2022-06-12: qty 30

## 2022-06-12 NOTE — Discharge Instructions (Addendum)
Please follow-up with your primary care doctor.  Keep the wound covered, and place bacitracin on the area to prevent infection.  Take Tylenol, Toradol for pain control.
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
## 2022-07-02 ENCOUNTER — Telehealth: Payer: Self-pay | Admitting: Family Medicine

## 2022-07-02 DIAGNOSIS — R3989 Other symptoms and signs involving the genitourinary system: Secondary | ICD-10-CM

## 2022-07-02 MED ORDER — NITROFURANTOIN MONOHYD MACRO 100 MG PO CAPS: 100.0000 mg | ORAL_CAPSULE | Freq: Two times a day (BID) | ORAL | 0 refills | Status: AC

## 2022-07-02 NOTE — Progress Notes (Signed)

## 2022-09-05 ENCOUNTER — Telehealth: Payer: Self-pay | Admitting: Physician Assistant

## 2022-09-05 DIAGNOSIS — J208 Acute bronchitis due to other specified organisms: Secondary | ICD-10-CM

## 2022-09-05 DIAGNOSIS — B9689 Other specified bacterial agents as the cause of diseases classified elsewhere: Secondary | ICD-10-CM

## 2022-09-05 MED ORDER — ALBUTEROL SULFATE HFA 108 (90 BASE) MCG/ACT IN AERS: 1.0000 | INHALATION_SPRAY | RESPIRATORY_TRACT | 0 refills | Status: AC | PRN

## 2022-09-05 MED ORDER — AZITHROMYCIN 250 MG PO TABS: ORAL_TABLET | ORAL | 0 refills | Status: AC

## 2022-09-05 MED ORDER — BENZONATATE 100 MG PO CAPS: 100.0000 mg | ORAL_CAPSULE | Freq: Three times a day (TID) | ORAL | 0 refills | Status: DC | PRN

## 2022-09-05 NOTE — Progress Notes (Signed)
We are sorry that you are not feeling well.  Here is how we plan to help!  Based on your presentation I believe you most likely have A cough due to bacteria.  When patients have a fever and a productive cough with a change in color or increased sputum production, we are concerned about bacterial bronchitis.  If left untreated it can progress to pneumonia.  If your symptoms do not improve with your treatment plan it is important that you contact your provider.   I have prescribed Azithromyin 250 mg: two tablets now and then one tablet daily for 4 additonal days    In addition you may use A non-prescription cough medication called Mucinex DM: take 2 tablets every 12 hours. and A prescription cough medication called Tessalon Perles 100mg . You may take 1-2 capsules every 8 hours as needed for your cough.  I have also prescribed an Albuterol inhaler Use 1-2 puffs every 4-6 hours as needed for wheezing.   From your responses in the eVisit questionnaire you describe inflammation in the upper respiratory tract which is causing a significant cough.  This is commonly called Bronchitis and has four common causes:   Allergies Viral Infections Acid Reflux Bacterial Infection Allergies, viruses and acid reflux are treated by controlling symptoms or eliminating the cause. An example might be a cough caused by taking certain blood pressure medications. You stop the cough by changing the medication. Another example might be a cough caused by acid reflux. Controlling the reflux helps control the cough.  USE OF BRONCHODILATOR ("RESCUE") INHALERS: There is a risk from using your bronchodilator too frequently.  The risk is that over-reliance on a medication which only relaxes the muscles surrounding the breathing tubes can reduce the effectiveness of medications prescribed to reduce swelling and congestion of the tubes themselves.  Although you feel brief relief from the bronchodilator inhaler, your asthma may actually  be worsening with the tubes becoming more swollen and filled with mucus.  This can delay other crucial treatments, such as oral steroid medications. If you need to use a bronchodilator inhaler daily, several times per day, you should discuss this with your provider.  There are probably better treatments that could be used to keep your asthma under control.     HOME CARE Only take medications as instructed by your medical team. Complete the entire course of an antibiotic. Drink plenty of fluids and get plenty of rest. Avoid close contacts especially the very young and the elderly Cover your mouth if you cough or cough into your sleeve. Always remember to wash your hands A steam or ultrasonic humidifier can help congestion.   GET HELP RIGHT AWAY IF: You develop worsening fever. You become short of breath You cough up blood. Your symptoms persist after you have completed your treatment plan MAKE SURE YOU  Understand these instructions. Will watch your condition. Will get help right away if you are not doing well or get worse.    Thank you for choosing an e-visit.  Your e-visit answers were reviewed by a board certified advanced clinical practitioner to complete your personal care plan. Depending upon the condition, your plan could have included both over the counter or prescription medications.  Please review your pharmacy choice. Make sure the pharmacy is open so you can pick up prescription now. If there is a problem, you may contact your provider through [employer] and have the prescription routed to another pharmacy.  Your safety is important to [place]. If  you have drug allergies check your prescription carefully.   For the next 24 hours you can use MyChart to ask questions about today's visit, request a non-urgent call back, or ask for a work or school excuse. You will get an email in the next two days asking about your experience. I hope that your e-visit has been valuable and will  speed your recovery.  I have spent 5 minutes in review of e-visit questionnaire, review and updating patient chart, medical decision making and response to patient.   Mar Daring, PA-C

## 2022-10-03 ENCOUNTER — Telehealth: Payer: Self-pay | Admitting: Physician Assistant

## 2022-10-03 DIAGNOSIS — B3731 Acute candidiasis of vulva and vagina: Secondary | ICD-10-CM

## 2022-10-03 MED ORDER — FLUCONAZOLE 150 MG PO TABS: 150.0000 mg | ORAL_TABLET | Freq: Every day | ORAL | 0 refills | Status: DC

## 2022-10-03 NOTE — Progress Notes (Signed)
I have spent 5 minutes in review of e-visit questionnaire, review and updating patient chart, medical decision making and response to patient.   Fields Oros Cody Tanasha Menees, PA-C    

## 2022-10-03 NOTE — Progress Notes (Signed)

## 2022-10-19 ENCOUNTER — Telehealth: Payer: Medicaid Other | Admitting: Physician Assistant

## 2022-10-19 DIAGNOSIS — T2650XA Corrosion of unspecified eyelid and periocular area, initial encounter: Secondary | ICD-10-CM
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
## 2022-11-14 ENCOUNTER — Telehealth: Payer: Medicaid Other | Admitting: Physician Assistant

## 2022-11-14 DIAGNOSIS — B9689 Other specified bacterial agents as the cause of diseases classified elsewhere: Secondary | ICD-10-CM

## 2022-11-14 DIAGNOSIS — N760 Acute vaginitis: Secondary | ICD-10-CM

## 2022-11-15 MED ORDER — CLINDAMYCIN PHOSPHATE 2 % VA CREA: 1.0000 | TOPICAL_CREAM | Freq: Every day | VAGINAL | 0 refills | Status: DC

## 2022-11-15 NOTE — Progress Notes (Signed)
I have spent 5 minutes in review of e-visit questionnaire, review and updating patient chart, medical decision making and response to patient.   Tequlia Gonsalves Cody Trevante Tennell, PA-C    

## 2022-11-15 NOTE — Progress Notes (Signed)

## 2023-01-23 ENCOUNTER — Telehealth: Payer: Medicaid Other | Admitting: Physician Assistant

## 2023-01-23 DIAGNOSIS — R3989 Other symptoms and signs involving the genitourinary system: Secondary | ICD-10-CM

## 2023-01-24 NOTE — Progress Notes (Signed)
Thank you for submitting your e-visit. We are sorry that you are not feeling well! On review of your submission, you have either listed that you are in a state outside our coverage area or have listed a pharmacy outside that area. Our providers are licensed in [place] and [place], and cannot treat or send a medication to a patient outside that coverage area due to telehealth laws.   In order to make sure you are taken care of today, there are a few options:  (1) Contact your PCP to see if they can see you via video and send the medication in for you (2) You can click HERE to be taken to our [place] [facility] Anytime site to be paired with a provider licensed in your area. When signing up for a visit, you need to select the state you are currently in so you will be able to select the proper providers (3) Be seen locally at an urgent care facility.   Thank you again for choosing [facility] for your care needs. We hope you feel better soon!  

## 2023-01-25 ENCOUNTER — Telehealth: Payer: Medicaid Other | Admitting: Physician Assistant

## 2023-01-25 DIAGNOSIS — R3989 Other symptoms and signs involving the genitourinary system: Secondary | ICD-10-CM | POA: Diagnosis not present

## 2023-01-25 MED ORDER — NITROFURANTOIN MONOHYD MACRO 100 MG PO CAPS: 100.0000 mg | ORAL_CAPSULE | Freq: Two times a day (BID) | ORAL | 0 refills | Status: DC

## 2023-01-25 NOTE — Progress Notes (Signed)
E-Visit for Urinary Problems  We are sorry that you are not feeling well.  Here is how we plan to help!  Based on what you shared with me it looks like you most likely have a simple urinary tract infection.  A UTI (Urinary Tract Infection) is a bacterial infection of the bladder.  Most cases of urinary tract infections are simple to treat but a key part of your care is to encourage you to drink plenty of fluids and watch your symptoms carefully.  I have prescribed MacroBid 100 mg twice a day for 5 days.  Your symptoms should gradually improve. Call us if the burning in your urine worsens, you develop worsening fever, back pain or pelvic pain or if your symptoms do not resolve after completing the antibiotic.  Urinary tract infections can be prevented by drinking plenty of water to keep your body hydrated.  Also be sure when you wipe, wipe from front to back and don't hold it in!  If possible, empty your bladder every 4 hours.  HOME CARE Drink plenty of fluids Compete the full course of the antibiotics even if the symptoms resolve Remember, when you need to go.go. Holding in your urine can increase the likelihood of getting a UTI! GET HELP RIGHT AWAY IF: You cannot urinate You get a high fever Worsening back pain occurs You see blood in your urine You feel sick to your stomach or throw up You feel like you are going to pass out  MAKE SURE YOU  Understand these instructions. Will watch your condition. Will get help right away if you are not doing well or get worse.   Thank you for choosing an e-visit.  Your e-visit answers were reviewed by a board certified advanced clinical practitioner to complete your personal care plan. Depending upon the condition, your plan could have included both over the counter or prescription medications.  Please review your pharmacy choice. Make sure the pharmacy is open so you can pick up prescription now. If there is a problem, you may contact your  provider through MyChart messaging and have the prescription routed to another pharmacy.  Your safety is important to us. If you have drug allergies check your prescription carefully.   For the next 24 hours you can use MyChart to ask questions about today's visit, request a non-urgent call back, or ask for a work or school excuse. You will get an email in the next two days asking about your experience. I hope that your e-visit has been valuable and will speed your recovery.  I have spent 5 minutes in review of e-visit questionnaire, review and updating patient chart, medical decision making and response to patient.   Damonte Frieson M Arshi Duarte, PA-C  

## 2023-01-28 ENCOUNTER — Telehealth: Payer: Medicaid Other | Admitting: Physician Assistant

## 2023-01-28 DIAGNOSIS — B379 Candidiasis, unspecified: Secondary | ICD-10-CM

## 2023-01-29 MED ORDER — FLUCONAZOLE 150 MG PO TABS: 150.0000 mg | ORAL_TABLET | Freq: Every day | ORAL | 0 refills | Status: DC

## 2023-01-29 NOTE — Progress Notes (Signed)

## 2023-01-29 NOTE — Progress Notes (Signed)
I have spent 5 minutes in review of e-visit questionnaire, review and updating patient chart, medical decision making and response to patient.   Mischa Pollard Cody Jeanean Hollett, PA-C    

## 2023-02-17 ENCOUNTER — Telehealth: Payer: Medicaid Other | Admitting: Physician Assistant

## 2023-02-17 DIAGNOSIS — N390 Urinary tract infection, site not specified: Secondary | ICD-10-CM

## 2023-02-18 NOTE — Progress Notes (Signed)
Because you have had frequent, recurrent UTI symptoms in a short period of time, I feel your condition warrants further evaluation and I recommend that you be seen in a face to face visit. With recurrent UTIs we normally recommend to have a urine test to see what bacteria is present and if any antibiotic resistance is present contributing to the recurrence.    NOTE: There will be NO CHARGE for this eVisit   If you are having a true medical emergency please call 911.      For an urgent face to face visit, [place] has eight urgent care centers for your convenience:   NEW!! [facility] at [facility] Get Driving Directions [phone] [address], [address] [place], [address]    [facility] Health Urgent Care Center at [facility] Get Driving Directions [phone] [address] [place], [place] [address]   [facility] Health Urgent Care Center [facility]) Get Driving Directions [phone] [address] [place], [place] [address]  [facility] Health Urgent Care Center [facility]) Get Driving Directions [phone] [address] [employer],  [place]  [address]  [facility] Urgent Care Center [facility] at [employer]  [phone] [address] [place],  [place] [address]   [facility] Health Urgent Care at [facility] Get Driving Directions [phone] [address] [place], [place] [address]   [facility] Health Urgent Care at [facility] Get Driving Directions  [phone] [address] [place], [place] [address]   [facility] Urgent Care at [facility] Directions [phone] [address] [place], [place] [address]  Your MyChart E-visit questionnaire answers were reviewed by a board certified advanced clinical practitioner to complete your personal care plan based on your specific symptoms.  Thank you for using  e-Visits.   I have spent 5 minutes in review of e-visit questionnaire, review and updating patient chart, medical decision making and response to patient.   Margaretann Loveless, PA-C

## 2023-08-13 ENCOUNTER — Telehealth: Payer: Medicaid Other | Admitting: Physician Assistant

## 2023-08-13 DIAGNOSIS — R3989 Other symptoms and signs involving the genitourinary system: Secondary | ICD-10-CM | POA: Diagnosis not present

## 2023-08-14 MED ORDER — SULFAMETHOXAZOLE-TRIMETHOPRIM 800-160 MG PO TABS: 1.0000 | ORAL_TABLET | Freq: Two times a day (BID) | ORAL | 0 refills | Status: AC

## 2023-08-14 NOTE — Progress Notes (Signed)
I have spent 5 minutes in review of e-visit questionnaire, review and updating patient chart, medical decision making and response to patient.   Mia Milan Cody Jacklynn Dehaas, PA-C    

## 2023-08-14 NOTE — Progress Notes (Signed)
E-Visit for Urinary Problems  We are sorry that you are not feeling well.  Here is how we plan to help!  Based on what you shared with me it looks like you most likely have a simple urinary tract infection.  A UTI (Urinary Tract Infection) is a bacterial infection of the bladder.  Most cases of urinary tract infections are simple to treat but a key part of your care is to encourage you to drink plenty of fluids and watch your symptoms carefully.  I have prescribed Bactrim DS One tablet twice a day for 5 days.  Your symptoms should gradually improve. Call us if the burning in your urine worsens, you develop worsening fever, back pain or pelvic pain or if your symptoms do not resolve after completing the antibiotic.  Urinary tract infections can be prevented by drinking plenty of water to keep your body hydrated.  Also be sure when you wipe, wipe from front to back and don't hold it in!  If possible, empty your bladder every 4 hours.  HOME CARE Drink plenty of fluids Compete the full course of the antibiotics even if the symptoms resolve Remember, when you need to go.go. Holding in your urine can increase the likelihood of getting a UTI! GET HELP RIGHT AWAY IF: You cannot urinate You get a high fever Worsening back pain occurs You see blood in your urine You feel sick to your stomach or throw up You feel like you are going to pass out  MAKE SURE YOU  Understand these instructions. Will watch your condition. Will get help right away if you are not doing well or get worse.   Thank you for choosing an e-visit.  Your e-visit answers were reviewed by a board certified advanced clinical practitioner to complete your personal care plan. Depending upon the condition, your plan could have included both over the counter or prescription medications.  Please review your pharmacy choice. Make sure the pharmacy is open so you can pick up prescription now. If there is a problem, you may contact  your provider through MyChart messaging and have the prescription routed to another pharmacy.  Your safety is important to us. If you have drug allergies check your prescription carefully.   For the next 24 hours you can use MyChart to ask questions about today's visit, request a non-urgent call back, or ask for a work or school excuse. You will get an email in the next two days asking about your experience. I hope that your e-visit has been valuable and will speed your recovery.  

## 2023-09-21 ENCOUNTER — Telehealth: Payer: Medicaid Other | Admitting: Nurse Practitioner

## 2023-09-21 DIAGNOSIS — A6004 Herpesviral vulvovaginitis: Secondary | ICD-10-CM | POA: Diagnosis not present

## 2023-09-21 MED ORDER — NAPROXEN 500 MG PO TABS: 500.0000 mg | ORAL_TABLET | Freq: Two times a day (BID) | ORAL | 0 refills | Status: AC

## 2023-09-21 NOTE — Progress Notes (Signed)
E-Visit for Herpes Simplex  We are sorry that you are not feeling well.  Here is how we plan to help!  Based on what you have shared ith me, it looks like you may be having an outbreak/flare-up of genital herpes.    I have prescribed NAPROXEN FOR PAIN.   If you have been prescribed long term medications to be taken on a regular basis, it is important to follow the recommendations and take them as ordered.    Outbreaks usually include blisters and open sores in the genital area. Outbreaks that happen after the first time are usually not as severe and do not last as long. Genital Herpes Simplex is a commonly sexually transmitted viral infection that is found worldwide. Most of these genital infections are caused by one or two herpes simplex viruses that is passed from person to person during vaginal, oral, or anal sex. Sometimes, people do not know they have herpes because they do not have any symptoms.  Please be aware that if you have genital herpes you can be contagious even when you are not having rash or flare-up and you may not have any symptoms, even when you are taking suppressive medicines.  Herpes cannot be cured. The disease usually causes most problems during the first few years. After that, the virus is still there, but it causes few to no symptoms. Even when the virus is active, people with herpes can take medicines to reduce and help prevent symptoms.  Herpes is an infection that can cause blisters and open sores on the genital area. Herpes is caused by a virus that is passed from person to person during vaginal, oral, or anal sex. Sometimes, people do not know they have herpes because they do not have any symptoms. Herpes cannot be cured. The disease usually causes most problems during the first few years. After that, the virus is still there, but it causes few to no symptoms. Even when the virus is active, people with herpes can take medicines to reduce and help prevent symptoms.  If  you have been prescribed medications to be taken on a regular basis, it is important to follow the recommendations and take them as ordered.  Some people with herpes never have any symptoms. But other people can develop symptoms within a few weeks of being infected with the herpes virus   Symptoms usually include blisters in the genital area. In women, this area includes the vagina, buttocks, anus, or thighs. In men, this area includes the penis, scrotum, anus, butt, or thighs. The blisters can become painful open sores, which then crust over as they heal. Sometimes, people can have other symptoms that include:  ?Blisters on the mouth or lips ?Fever, headache, or pain in the joints ?Trouble urinating  Outbreaks might occur every month or more often, or just once or twice a year. Sometimes, people can tell when an outbreak will occur, because they feel itching or pain beforehand. Sometimes they do not know that an outbreak is coming because they have no symptoms. Whatever your pattern is, keep in mind that herpes outbreaks usually become less frequent over time as you get older. Certain things, called "triggers," can make outbreaks more likely to occur. These include stress, sunlight, menstrual periods,or getting sick.  Antiviral therapy can shorten the duration of symptoms and signs in primary infection, which, when untreated, can be associated with significant increase in the symptoms of the disease.  HOME CARE Use a portable bath (such as a "  Sitz bath") where you can sit in warm water for about 20 minutes. Your bathtub could also work. Avoid bubble baths.  Keep the genital area clean and dry and avoid tight clothes.  Take over-the-counter pain medicine such as acetaminophen (brand name: Tylenol) or ibuprofen sample brand names: Advil, Motrin). But avoid aspirin.  Only take medications as instructed by your medical team.  You are most likely to spread herpes to a sex partner when you have  blisters and open sores on your body. But it's also possible to spread herpes to your partner when you do not have any symptoms. That is because herpes can be present on your body without causing any symptoms, like blisters or pain.  Telling your sex partner that you have herpes can be hard. But it can help protect them, since there are ways to lower the risk of spreading the infection.   Using a condom every time you have sex  Not having sex when you have symptoms  Not having oral sex if you have blisters or open sores (in the genital area or around your mouth)  MAKE SURE YOU   Understand these instructions. Do not have sex without using a condom until you have been seen by a doctor and as instructed by the provider If you are not better or improved within 7 days, you MUST have a follow up at your doctor or the health department for evaluation. There are other causes of rashes in the genital region.  Thank you for choosing an e-visit.  Your e-visit answers were reviewed by a board certified advanced clinical practitioner to complete your personal care plan. Depending upon the condition, your plan could have included both over the counter or prescription medications.  Please review your pharmacy choice. Make sure the pharmacy is open so you can pick up prescription now. If there is a problem, you may contact your provider through [employer] and have the prescription routed to another pharmacy.  Your safety is important to [place]. If you have drug allergies check your prescription carefully.   For the next 24 hours you can use MyChart to ask questions about today's visit, request a non-urgent call back, or ask for a work or school excuse. You will get an email in the next two days asking about your experience. I hope that your e-visit has been valuable and will speed your recovery.

## 2023-09-21 NOTE — Progress Notes (Signed)
I have spent 5 minutes in review of e-visit questionnaire, review and updating patient chart, medical decision making and response to patient.   Claiborne Rigg, NP
